# Patient Record
Sex: Male | Born: 1965 | Race: White | Hispanic: No | Marital: Married | Smoking: Never smoker
Health system: Southern US, Community
[De-identification: ages and names within clinical notes are randomized; demographics above are authoritative.]

## PROBLEM LIST (undated history)

## (undated) DIAGNOSIS — F419 Anxiety disorder, unspecified: Secondary | ICD-10-CM

## (undated) HISTORY — DX: Anxiety disorder, unspecified: F41.9

---

## 2014-10-09 ENCOUNTER — Ambulatory Visit (INDEPENDENT_AMBULATORY_CARE_PROVIDER_SITE_OTHER): Payer: BC Managed Care – PPO | Admitting: Podiatry

## 2014-10-09 ENCOUNTER — Encounter: Payer: Self-pay | Admitting: Podiatry

## 2014-10-09 VITALS — BP 155/94 | HR 92 | Resp 16 | Ht 70.0 in | Wt 190.0 lb

## 2014-10-09 DIAGNOSIS — B079 Viral wart, unspecified: Secondary | ICD-10-CM

## 2014-10-09 DIAGNOSIS — B078 Other viral warts: Secondary | ICD-10-CM

## 2014-10-09 NOTE — Progress Notes (Signed)
   Subjective:    Patient ID: Derek [name], male    DOB: 09/25/1965, 49 y.o.   MRN: [id number]  HPI Left foot plantar wart  Started in November. My primary doctor did a treatment on it and it has blistered. That was about a month ago    Review of Systems  All other systems reviewed and are negative.      Objective:   Physical Exam        Assessment & Plan:

## 2014-10-10 NOTE — Progress Notes (Signed)
Subjective:     Patient ID: Derek [name], male   DOB: 12/07/1965, 49 y.o.   MRN: [id number]  HPI patient presents with a 4 month history of lesions on the bottom of his left foot that was treated with cryotherapy with quite a bit of pain associated with it   Review of Systems  All other systems reviewed and are negative.      Objective:   Physical Exam  Constitutional: He is oriented to person, place, and time.  Cardiovascular: Intact distal pulses.   Musculoskeletal: Normal range of motion.  Neurological: He is oriented to person, place, and time.  Skin: Skin is warm.  Nursing note and vitals reviewed.  neurovascular status intact with muscle strength adequate and range of motion subtalar midtarsal joint within normal limits. Patient is noted to have good digital perfusion is well oriented 3 and has several lesions on the plantar aspect of the left distal metatarsal area and of the third digit with him point bleeding noted upon upon debridement and pain to lateral pressure     Assessment:     Probable verruca plantaris plantar aspect left foot with pain    Plan:     H&P and condition explained. Debrided the lesions and applied chemical with sterile dressings and explaining what to do if any kind of blistering were to occur

## 2014-10-17 ENCOUNTER — Ambulatory Visit: Payer: BC Managed Care – PPO | Admitting: Podiatry

## 2014-11-02 ENCOUNTER — Ambulatory Visit: Payer: BC Managed Care – PPO | Admitting: Podiatry

## 2018-06-08 ENCOUNTER — Other Ambulatory Visit: Payer: Self-pay | Admitting: Psychiatry

## 2018-06-08 NOTE — Telephone Encounter (Signed)
Need to review paper chart  

## 2018-07-02 ENCOUNTER — Other Ambulatory Visit: Payer: Self-pay | Admitting: Psychiatry

## 2018-08-04 ENCOUNTER — Ambulatory Visit: Payer: BC Managed Care – PPO | Admitting: Psychiatry

## 2018-08-04 ENCOUNTER — Encounter: Payer: Self-pay | Admitting: Psychiatry

## 2018-08-04 VITALS — BP 157/102 | HR 70

## 2018-08-04 DIAGNOSIS — F418 Other specified anxiety disorders: Secondary | ICD-10-CM | POA: Diagnosis not present

## 2018-08-04 DIAGNOSIS — F4010 Social phobia, unspecified: Secondary | ICD-10-CM | POA: Diagnosis not present

## 2018-08-04 NOTE — Progress Notes (Signed)
Rayshaun Alfieri [id number] 01-06-1966 53 y.o.  Subjective:   Patient ID:  Kyung Woodman is a 53 y.o. (DOB 06/19/1966) male.  Chief Complaint:  Chief Complaint  Patient presents with  . Follow-up    Medication Managament  . Anxiety    HPI Mykhael Swasey presents to the office today for follow-up of anxiety.   Still some anxiety and sweating when meets clients and some anxiety and weight gain.  Tried 20mg  and had SE dryness and sexual SE.  Still fixated on public speaking and getting anxious over that.  Has used propranolol for public speaking anxiety for 83-38 mg daily with some benefit.  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  Prior psych meds:  Zoloft 50 cog SE and felt physically revved?, Lexapro 20.  Review of Systems:  Review of Systems  Neurological: Negative for tremors and weakness.  Psychiatric/Behavioral: Negative for agitation, behavioral problems, confusion, decreased concentration, dysphoric mood, hallucinations, self-injury, sleep disturbance and suicidal ideas. The patient is nervous/anxious. The patient is not hyperactive.     Medications: I have reviewed the patient's current medications.  Current Outpatient Medications  Medication Sig Dispense Refill  . lithium carbonate 150 MG capsule Take 150 mg by mouth daily.    [name] PARoxetine (PAXIL) 10 MG tablet TAKE ONE AND ONE-HALF TABLETS BY MOUTH DAILY AT BEDTIME 135 tablet 0  . propranolol (INDERAL) 20 MG tablet TAKE ONE TO TWO TABLETS BY MOUTH TWICE A DAY AS NEEDED FOR ANXIETY 100 tablet 1   No current facility-administered medications for this visit.     Medication Side Effects: Other: as noted.  Allergies: No Known Allergies  Past Medical History:  Diagnosis Date  . Anxiety     Family History  Problem Relation Age of Onset  . Anxiety disorder Father   . Depression Father     Social History   Socioeconomic History  . Marital status: Married    Spouse name: Not on file  . Number of  children: Not on file  . Years of education: Not on file  . Highest education level: Not on file  Occupational History  . Not on file  Social Needs  . Financial resource strain: Not on file  . Food insecurity:    Worry: Not on file    Inability: Not on file  . Transportation needs:    Medical: Not on file    Non-medical: Not on file  Tobacco Use  . Smoking status: Never Smoker  . Smokeless tobacco: Former Engineer, water and Sexual Activity  . Alcohol use: Yes    Comment: weekly-social  . Drug use: Never  . Sexual activity: Yes    Partners: Female    Birth control/protection: None  Lifestyle  . Physical activity:    Days per week: Not on file    Minutes per session: Not on file  . Stress: Not on file  Relationships  . Social connections:    Talks on phone: Not on file    Gets together: Not on file    Attends religious service: Not on file    Active member of club or organization: Not on file    Attends meetings of clubs or organizations: Not on file    Relationship status: Not on file  . Intimate partner violence:    Fear of current or ex partner: Not on file    Emotionally abused: Not on file    Physically abused: Not on file    Forced sexual  activity: Not on file  Other Topics Concern  . Not on file  Social History Narrative  . Not on file    Past Medical History, Surgical history, Social history, and Family history were reviewed and updated as appropriate.   Please see review of systems for further details on the patient's review from today.   Objective:   Physical Exam:  BP (!) 157/102 (BP Location: Left Arm)   Pulse 70   Physical Exam Constitutional:      General: He is not in acute distress.    Appearance: He is well-developed.  Musculoskeletal:        General: No deformity.  Neurological:     Mental Status: He is alert and oriented to person, place, and time.     Motor: No tremor.     Coordination: Coordination normal.     Gait: Gait normal.   Psychiatric:        Attention and Perception: Attention and perception normal.        Mood and Affect: Mood is anxious. Mood is not depressed. Affect is not labile, blunt, angry or inappropriate.        Speech: Speech normal.        Behavior: Behavior normal.        Thought Content: Thought content normal. Thought content does not include homicidal or suicidal ideation. Thought content does not include homicidal or suicidal plan.        Cognition and Memory: Cognition normal.        Judgment: Judgment normal.     Comments: Insight intact. No auditory or visual hallucinations. No delusions.      Lab Review:  No results found for: NA, K, CL, CO2, GLUCOSE, BUN, CREATININE, CALCIUM, PROT, ALBUMIN, AST, ALT, ALKPHOS, BILITOT, GFRNONAA, GFRAA  No results found for: WBC, RBC, HGB, HCT, PLT, MCV, MCH, MCHC, RDW, LYMPHSABS, MONOABS, EOSABS, BASOSABS  No results found for: POCLITH, LITHIUM   No results found for: PHENYTOIN, PHENOBARB, VALPROATE, CBMZ   .res Assessment: Plan:    Social anxiety disorder  Performance anxiety   Greater than 50% of face to face time with patient was spent on counseling and coordination of care. We discussed  The following:  Disc potential cognitive benefit off label of N-acetylcysteine 600 mg daily.  Options increase paroxetine, return to Zoloft  And increase the dosage, viibryd.  Discussed these options in detail.  Because he felt revved up from sertraline he is probably not going to be able to tolerate fluoxetine.  Discussed the differences between SSRIs side effect profiles. He prefers retry increasing the paroxetine back to 20 mg daily.  Option clonidine as alternative for sweating.  This appt was 30 mins.  Meredith Staggersarey Cottle, MD, DFAPA    Please see After Visit Summary for patient specific instructions.  No future appointments.  No orders of the defined types were placed in this encounter.     -------------------------------

## 2018-08-28 ENCOUNTER — Other Ambulatory Visit: Payer: Self-pay | Admitting: Psychiatry

## 2018-09-29 ENCOUNTER — Encounter: Payer: Self-pay | Admitting: Psychiatry

## 2018-09-29 ENCOUNTER — Ambulatory Visit: Payer: BC Managed Care – PPO | Admitting: Psychiatry

## 2018-09-29 ENCOUNTER — Other Ambulatory Visit: Payer: Self-pay

## 2018-09-29 DIAGNOSIS — F418 Other specified anxiety disorders: Secondary | ICD-10-CM

## 2018-09-29 DIAGNOSIS — F4010 Social phobia, unspecified: Secondary | ICD-10-CM

## 2018-09-29 MED ORDER — PAROXETINE HCL 20 MG PO TABS: 20.0000 mg | ORAL_TABLET | Freq: Every day | ORAL | 0 refills | Status: DC

## 2018-09-29 NOTE — Patient Instructions (Signed)
Plan types include [place], sword plants including chain sword plants.  Google search low tech aquarium plants.  Planted tank substrate by [employer] Don't get [employer] substrate.

## 2018-09-29 NOTE — Progress Notes (Signed)
Derek [name] [id number] 09-Jan-1966 53 y.o.  Subjective:   Patient ID:  Derek [name] is a 53 y.o. (DOB 25-Nov-1965) male.  Chief Complaint:  Chief Complaint  Patient presents with  . Follow-up    Medication Management  . Anxiety    Medication Management    Anxiety  Patient reports no confusion, decreased concentration, nervous/anxious behavior or suicidal ideas.     Derek [name] presents to the office today for follow-up of anxiety.   Patient last seen August 04, 2018.  For his anxiety we increased paroxetine back to 20 from 15 mg daily.  He was also given the option of N-acetylcysteine for cognitive complaints.  Tolerating it better than the first time but still unusual gum bleeding tolerable.  Much better re: anxiety.  SE endless grazing urge in the evening.  Anxiety is now manageable.   Weight up 2-3# since here.  Much less anxiety and sweating when meets clients and some anxiety.  Tried 20mg  and had SE dryness and sexual SE.  Less fixated on public speaking and getting anxious over that.  Has used propranolol for public speaking anxiety for 54-93 mg daily with some benefit and when flying and this has helped..  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  He is planning planted aquarium  Prior psych meds:  Zoloft 50 cog SE and felt physically revved even after a year and initital insomnia, Lexapro 20.  Review of Systems:  Review of Systems  Neurological: Negative for tremors and weakness.  Psychiatric/Behavioral: Negative for agitation, behavioral problems, confusion, decreased concentration, dysphoric mood, hallucinations, self-injury, sleep disturbance and suicidal ideas. The patient is not nervous/anxious and is not hyperactive.     Medications: I have reviewed the patient's current medications.  Current Outpatient Medications  Medication Sig Dispense Refill  . lithium carbonate 150 MG capsule Take 150 mg by mouth daily.    [name] PARoxetine (PAXIL) 20 MG  tablet Take 1 tablet (20 mg total) by mouth at bedtime. 30 tablet 2  . propranolol (INDERAL) 20 MG tablet TAKE ONE TO TWO TABLETS BY MOUTH TWICE A DAY AS NEEDED FOR ANXIETY 100 tablet 1   No current facility-administered medications for this visit.     Medication Side Effects: increased appetite, some sexual SE  Allergies: No Known Allergies  Past Medical History:  Diagnosis Date  . Anxiety     Family History  Problem Relation Age of Onset  . Anxiety disorder Father   . Depression Father     Social History   Socioeconomic History  . Marital status: Married    Spouse name: Not on file  . Number of children: Not on file  . Years of education: Not on file  . Highest education level: Not on file  Occupational History  . Not on file  Social Needs  . Financial resource strain: Not on file  . Food insecurity:    Worry: Not on file    Inability: Not on file  . Transportation needs:    Medical: Not on file    Non-medical: Not on file  Tobacco Use  . Smoking status: Never Smoker  . Smokeless tobacco: Former Engineer, water and Sexual Activity  . Alcohol use: Yes    Comment: weekly-social  . Drug use: Never  . Sexual activity: Yes    Partners: Female    Birth control/protection: None  Lifestyle  . Physical activity:    Days per week: Not on file    Minutes per  session: Not on file  . Stress: Not on file  Relationships  . Social connections:    Talks on phone: Not on file    Gets together: Not on file    Attends religious service: Not on file    Active member of club or organization: Not on file    Attends meetings of clubs or organizations: Not on file    Relationship status: Not on file  . Intimate partner violence:    Fear of current or ex partner: Not on file    Emotionally abused: Not on file    Physically abused: Not on file    Forced sexual activity: Not on file  Other Topics Concern  . Not on file  Social History Narrative  . Not on file    Past  Medical History, Surgical history, Social history, and Family history were reviewed and updated as appropriate.   Please see review of systems for further details on the patient's review from today.   Objective:   Physical Exam:  There were no vitals taken for this visit.  Physical Exam Constitutional:      General: He is not in acute distress.    Appearance: He is well-developed.  Musculoskeletal:        General: No deformity.  Neurological:     Mental Status: He is alert and oriented to person, place, and time.     Motor: No tremor.     Coordination: Coordination normal.     Gait: Gait normal.  Psychiatric:        Attention and Perception: Attention and perception normal.        Mood and Affect: Mood is not anxious or depressed. Affect is not labile, blunt, angry or inappropriate.        Speech: Speech normal.        Behavior: Behavior normal.        Thought Content: Thought content normal. Thought content does not include homicidal or suicidal ideation. Thought content does not include homicidal or suicidal plan.        Cognition and Memory: Cognition normal.        Judgment: Judgment normal.     Comments: Insight intact. No auditory or visual hallucinations. No delusions.      Lab Review:  No results found for: NA, K, CL, CO2, GLUCOSE, BUN, CREATININE, CALCIUM, PROT, ALBUMIN, AST, ALT, ALKPHOS, BILITOT, GFRNONAA, GFRAA  No results found for: WBC, RBC, HGB, HCT, PLT, MCV, MCH, MCHC, RDW, LYMPHSABS, MONOABS, EOSABS, BASOSABS  No results found for: POCLITH, LITHIUM   No results found for: PHENYTOIN, PHENOBARB, VALPROATE, CBMZ   .res Assessment: Plan:    Social anxiety disorder  Performance anxiety   Overall good response to the paroxetine with anxiety managed.  However he has had some sexual side effects and increased appetite.  We discussed the pros and cons of continuing as is versus possibly start switching back to sertraline.  He may tolerate that better now than  he did before.  I suggested he wait 3 more months and see if perhaps he will have less side effects with the paroxetine over time and then consider a switch if necessary.  Disc potential cognitive benefit off label of N-acetylcysteine 600 mg daily.  The propranolol is also helping situational anxiety.  Option clonidine as alternative for sweating.  FU 3 mos  Meredith Staggers, MD, DFAPA    Please see After Visit Summary for patient specific instructions.  No future appointments.  No  orders of the defined types were placed in this encounter.     -------------------------------

## 2018-12-21 ENCOUNTER — Ambulatory Visit: Payer: BC Managed Care – PPO | Admitting: Psychiatry

## 2019-02-16 ENCOUNTER — Encounter (INDEPENDENT_AMBULATORY_CARE_PROVIDER_SITE_OTHER): Payer: Self-pay

## 2019-02-16 ENCOUNTER — Encounter

## 2019-02-16 ENCOUNTER — Other Ambulatory Visit: Payer: Self-pay

## 2019-02-16 ENCOUNTER — Ambulatory Visit (INDEPENDENT_AMBULATORY_CARE_PROVIDER_SITE_OTHER): Payer: BC Managed Care – PPO | Admitting: Psychiatry

## 2019-02-16 ENCOUNTER — Encounter: Payer: Self-pay | Admitting: Psychiatry

## 2019-02-16 DIAGNOSIS — F418 Other specified anxiety disorders: Secondary | ICD-10-CM | POA: Diagnosis not present

## 2019-02-16 DIAGNOSIS — F4010 Social phobia, unspecified: Secondary | ICD-10-CM | POA: Diagnosis not present

## 2019-02-16 MED ORDER — VIIBRYD 20 MG PO TABS: 20.0000 mg | ORAL_TABLET | Freq: Every day | ORAL | 0 refills | Status: DC

## 2019-02-16 NOTE — Patient Instructions (Signed)
Start Viibryd 10 mg daily with food and reduce paroxetine to 1/2 tablet daily.  After 1 week increase Viibryd to 20 mg daily and  Continue 1/2 paroxetine for 1 more week then stop it.

## 2019-02-16 NOTE — Progress Notes (Signed)
Gurinder Toral [id number] 1965-11-09 53 y.o.  Subjective:   Patient ID:  Derek [name] is a 53 y.o. (DOB February 11, 1966) male.  Chief Complaint:  Chief Complaint  Patient presents with  . Follow-up    Medication Management  . Anxiety  . Medication Problem    Anxiety Patient reports no confusion, decreased concentration, nervous/anxious behavior or suicidal ideas.     Derek [name] presents to the office today for follow-up of anxiety.   When seen August 04, 2018.  For his anxiety we increased paroxetine back to 20 from 15 mg daily.  He was also given the option of N-acetylcysteine for cognitive complaints.  Last seen in March.  No meds were changed.  He was satisfied with response.  Not in th e office for 5 mos DT Covid.  Energy a little lower with paroxetine.  Not as bad as with sertraline and motivation a little lower.  Still some sweating socially even with close friends.  Lower libido and delayed ejaculation.  Also weight px.  Wants med change.  Less assertive in office meetings not related to anxiety.  No significant propranolol.    Tolerating it better than the first time but still unusual gum bleeding tolerable.  Much better re: anxiety.  SE endless grazing urge in the evening.  Anxiety is now manageable.   Weight up 2-3# since here.  Much less anxiety and sweating when meets clients and some anxiety.  Tried 20mg  and had SE dryness and sexual SE.  Less fixated on public speaking and getting anxious over that.  Has used propranolol for public speaking anxiety for 20-40 mg daily with some benefit and when flying and this has helped..  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  He is planning planted aquarium  Prior psych meds:  Zoloft 50 cog SE and felt physically revved even after a year and initital insomnia, Lexapro 20.  Review of Systems:  Review of Systems  Constitutional: Positive for unexpected weight change.  Neurological: Negative for tremors and  weakness.  Psychiatric/Behavioral: Negative for agitation, behavioral problems, confusion, decreased concentration, dysphoric mood, hallucinations, self-injury, sleep disturbance and suicidal ideas. The patient is not nervous/anxious and is not hyperactive.     Medications: I have reviewed the patient's current medications.  Current Outpatient Medications  Medication Sig Dispense Refill  . Ascorbic Acid (VITAMIN C) 1000 MG tablet Take 1,000 mg by mouth daily.    [name] lithium carbonate 150 MG capsule Take 150 mg by mouth daily.    . Magnesium 100 MG TABS Take by mouth.    [name] PARoxetine (PAXIL) 20 MG tablet Take 1 tablet (20 mg total) by mouth at bedtime. 90 tablet 0  . propranolol (INDERAL) 20 MG tablet TAKE ONE TO TWO TABLETS BY MOUTH TWICE A DAY AS NEEDED FOR ANXIETY 100 tablet 1  . Zinc Sulfate (ZINC 15 PO) Take by mouth.    . Vilazodone HCl (VIIBRYD) 20 MG TABS Take 1 tablet (20 mg total) by mouth daily. 90 tablet 0   No current facility-administered medications for this visit.     Medication Side Effects: increased appetite, some sexual SE  Allergies: No Known Allergies  Past Medical History:  Diagnosis Date  . Anxiety     Family History  Problem Relation Age of Onset  . Anxiety disorder Father   . Depression Father     Social History   Socioeconomic History  . Marital status: Married    Spouse name: Not on file  .  Number of children: Not on file  . Years of education: Not on file  . Highest education level: Not on file  Occupational History  . Not on file  Social Needs  . Financial resource strain: Not on file  . Food insecurity    Worry: Not on file    Inability: Not on file  . Transportation needs    Medical: Not on file    Non-medical: Not on file  Tobacco Use  . Smoking status: Never Smoker  . Smokeless tobacco: Former Engineer, waterUser  Substance and Sexual Activity  . Alcohol use: Yes    Comment: weekly-social  . Drug use: Never  . Sexual activity: Yes     Partners: Female    Birth control/protection: None  Lifestyle  . Physical activity    Days per week: Not on file    Minutes per session: Not on file  . Stress: Not on file  Relationships  . Social Musicianconnections    Talks on phone: Not on file    Gets together: Not on file    Attends religious service: Not on file    Active member of club or organization: Not on file    Attends meetings of clubs or organizations: Not on file    Relationship status: Not on file  . Intimate partner violence    Fear of current or ex partner: Not on file    Emotionally abused: Not on file    Physically abused: Not on file    Forced sexual activity: Not on file  Other Topics Concern  . Not on file  Social History Narrative  . Not on file    Past Medical History, Surgical history, Social history, and Family history were reviewed and updated as appropriate.   Please see review of systems for further details on the patient's review from today.   Objective:   Physical Exam:  There were no vitals taken for this visit.  Physical Exam Constitutional:      General: He is not in acute distress.    Appearance: He is well-developed.  Musculoskeletal:        General: No deformity.  Neurological:     Mental Status: He is alert and oriented to person, place, and time.     Motor: No tremor.     Coordination: Coordination normal.     Gait: Gait normal.  Psychiatric:        Attention and Perception: Attention and perception normal.        Mood and Affect: Mood is anxious. Mood is not depressed. Affect is not labile, blunt, angry or inappropriate.        Speech: Speech normal.        Behavior: Behavior normal.        Thought Content: Thought content normal. Thought content does not include homicidal or suicidal ideation. Thought content does not include homicidal or suicidal plan.        Cognition and Memory: Cognition normal.        Judgment: Judgment normal.     Comments: Insight intact. No auditory or  visual hallucinations. No delusions.      Lab Review:  No results found for: NA, K, CL, CO2, GLUCOSE, BUN, CREATININE, CALCIUM, PROT, ALBUMIN, AST, ALT, ALKPHOS, BILITOT, GFRNONAA, GFRAA  No results found for: WBC, RBC, HGB, HCT, PLT, MCV, MCH, MCHC, RDW, LYMPHSABS, MONOABS, EOSABS, BASOSABS  No results found for: POCLITH, LITHIUM   No results found for: PHENYTOIN, PHENOBARB, VALPROATE, CBMZ   .  res Assessment: Plan:    Derek [name] was seen today for follow-up, anxiety and medication problem.  Diagnoses and all orders for this visit:  Social anxiety disorder -     Vilazodone HCl (VIIBRYD) 20 MG TABS; Take 1 tablet (20 mg total) by mouth daily.  Performance anxiety  Overall good response to the paroxetine with anxiety managed.  However he has had some sexual side effects and increased appetite.  Switch to Viibryd to reduce lethargy, weight, sexual SE.  Cross taper to 20 mg daily.   Disc SE in detail and SSRI withdrawal sx.  Also trial Qbrexxza for excessive sweating.  Disc FDA indications for under arm.  Disc SE.  Disc potential cognitive benefit off label of N-acetylcysteine 600 mg daily.  The propranolol is also helping situational anxiety.  Option clonidine as alternative for sweating.  FU 3 mos  Meredith Staggersarey Cottle, MD, DFAPA    Please see After Visit Summary for patient specific instructions.  Future Appointments  Date Time Provider Department Center  04/19/2019 11:00 AM Cottle, Steva Readyarey G Jr., MD [facility] None    No orders of the defined types were placed in this encounter.     -------------------------------

## 2019-03-31 ENCOUNTER — Ambulatory Visit (INDEPENDENT_AMBULATORY_CARE_PROVIDER_SITE_OTHER): Payer: BC Managed Care – PPO | Admitting: Psychiatry

## 2019-03-31 ENCOUNTER — Other Ambulatory Visit: Payer: Self-pay

## 2019-03-31 ENCOUNTER — Encounter: Payer: Self-pay | Admitting: Psychiatry

## 2019-03-31 DIAGNOSIS — F418 Other specified anxiety disorders: Secondary | ICD-10-CM

## 2019-03-31 DIAGNOSIS — R61 Generalized hyperhidrosis: Secondary | ICD-10-CM | POA: Diagnosis not present

## 2019-03-31 DIAGNOSIS — F4010 Social phobia, unspecified: Secondary | ICD-10-CM

## 2019-03-31 MED ORDER — LITHIUM CARBONATE 150 MG PO CAPS: 150.0000 mg | ORAL_CAPSULE | Freq: Every day | ORAL | 1 refills | Status: DC

## 2019-03-31 NOTE — Progress Notes (Signed)
Derek [name] [id number] 1966-03-19 53 y.o.  Subjective:   Patient ID:  Derek [name] is a 53 y.o. (DOB 13-Jan-1966) male.  Chief Complaint:  Chief Complaint  Patient presents with  . Follow-up    Medication Management and med change  . Anxiety    Medication Management  . Excessive Sweating    Follow-up medication    Anxiety Patient reports no confusion, decreased concentration, nervous/anxious behavior or suicidal ideas.     Derek [name] presents to the office today for follow-up of anxiety, especially social anxiety disorder.   Last visit February 16, 2019.  Because of sexual side effects with paroxetine we switched him to Viibryd.  Also initiated a trial of trial Qbrexxza for excessive sweating.  When first came in had come off anxiety and had panic and froze in public speaking for 30 min.  Wonders if has PTSD from it and fear of recurrence.  2 occ of near this since on Viibryd, 1 in social setting and other in preparation for performance anxiety situation.  Slightly more amped up, a little SOB, quicker to anger.  Another increase in weight 4-5 #.  A little trouble multitasking.  A little more trouble going to sleep.  No sig change in sexual function.  Some non problematic diarrhea.  But history of frequent BM. Better energy vs Paxil.     Not in th e office for 5 mos DT Covid.  Energy a little lower with paroxetine.  Not as bad as with sertraline and motivation a little lower.  Still some sweating socially even with close friends.  Lower libido and delayed ejaculation.  Also weight px.  Wants med change.  Less assertive in office meetings not related to anxiety.  No significant propranolol.    Tolerating it better than the first time but still unusual gum bleeding tolerable.  Much better re: anxiety.  SE endless grazing urge in the evening.  Anxiety is now manageable.   Weight up 2-3# since here.  Much less anxiety and sweating when meets clients and some anxiety.  Tried 20mg  and  had SE dryness and sexual SE.  Less fixated on public speaking and getting anxious over that.  Has used propranolol for public speaking anxiety for 62-13 mg daily with some benefit and when flying and this has helped..  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  He is planning planted aquarium  Prior psych meds:  Zoloft 50 cog SE and felt physically revved even after a year and initital insomnia, Lexapro 20, paroxetine helped but sexual SE  Review of Systems:  Review of Systems  Constitutional: Negative for unexpected weight change.  Neurological: Negative for tremors and weakness.  Psychiatric/Behavioral: Negative for agitation, behavioral problems, confusion, decreased concentration, dysphoric mood, hallucinations, self-injury, sleep disturbance and suicidal ideas. The patient is not nervous/anxious and is not hyperactive.     Medications: I have reviewed the patient's current medications.  Current Outpatient Medications  Medication Sig Dispense Refill  . Ascorbic Acid (VITAMIN C) 1000 MG tablet Take 1,000 mg by mouth daily.    [name] lithium carbonate 150 MG capsule Take 150 mg by mouth daily.    . Magnesium 100 MG TABS Take by mouth.    . propranolol (INDERAL) 20 MG tablet TAKE ONE TO TWO TABLETS BY MOUTH TWICE A DAY AS NEEDED FOR ANXIETY 100 tablet 1  . Vilazodone HCl (VIIBRYD) 20 MG TABS Take 1 tablet (20 mg total) by mouth daily. 90 tablet 0  .  Zinc Sulfate (ZINC 15 PO) Take by mouth.     No current facility-administered medications for this visit.     Medication Side Effects: increased appetite, some sexual SE  Allergies: No Known Allergies  Past Medical History:  Diagnosis Date  . Anxiety     Family History  Problem Relation Age of Onset  . Anxiety disorder Father   . Depression Father     Social History   Socioeconomic History  . Marital status: Married    Spouse name: Not on file  . Number of children: Not on file  . Years of education: Not on  file  . Highest education level: Not on file  Occupational History  . Not on file  Social Needs  . Financial resource strain: Not on file  . Food insecurity    Worry: Not on file    Inability: Not on file  . Transportation needs    Medical: Not on file    Non-medical: Not on file  Tobacco Use  . Smoking status: Never Smoker  . Smokeless tobacco: Former Engineer, waterUser  Substance and Sexual Activity  . Alcohol use: Yes    Comment: weekly-social  . Drug use: Never  . Sexual activity: Yes    Partners: Female    Birth control/protection: None  Lifestyle  . Physical activity    Days per week: Not on file    Minutes per session: Not on file  . Stress: Not on file  Relationships  . Social Musicianconnections    Talks on phone: Not on file    Gets together: Not on file    Attends religious service: Not on file    Active member of club or organization: Not on file    Attends meetings of clubs or organizations: Not on file    Relationship status: Not on file  . Intimate partner violence    Fear of current or ex partner: Not on file    Emotionally abused: Not on file    Physically abused: Not on file    Forced sexual activity: Not on file  Other Topics Concern  . Not on file  Social History Narrative  . Not on file    Past Medical History, Surgical history, Social history, and Family history were reviewed and updated as appropriate.   Please see review of systems for further details on the patient's review from today.   Objective:   Physical Exam:  There were no vitals taken for this visit.  Physical Exam Constitutional:      General: He is not in acute distress.    Appearance: He is well-developed.  Musculoskeletal:        General: No deformity.  Neurological:     Mental Status: He is alert and oriented to person, place, and time.     Motor: No tremor.     Coordination: Coordination normal.     Gait: Gait normal.  Psychiatric:        Attention and Perception: Attention and  perception normal.        Mood and Affect: Mood is anxious. Mood is not depressed. Affect is not labile, blunt, angry, tearful or inappropriate.        Speech: Speech normal.        Behavior: Behavior normal.        Thought Content: Thought content normal. Thought content does not include homicidal or suicidal ideation. Thought content does not include homicidal or suicidal plan.  Cognition and Memory: Cognition normal.        Judgment: Judgment normal.     Comments: Insight intact. No auditory or visual hallucinations. No delusions.      Lab Review:  No results found for: NA, K, CL, CO2, GLUCOSE, BUN, CREATININE, CALCIUM, PROT, ALBUMIN, AST, ALT, ALKPHOS, BILITOT, GFRNONAA, GFRAA  No results found for: WBC, RBC, HGB, HCT, PLT, MCV, MCH, MCHC, RDW, LYMPHSABS, MONOABS, EOSABS, BASOSABS  No results found for: POCLITH, LITHIUM   No results found for: PHENYTOIN, PHENOBARB, VALPROATE, CBMZ   .res Assessment: Plan:    Derek [name] was seen today for follow-up, anxiety and excessive sweating.  Diagnoses and all orders for this visit:  Social anxiety disorder  Performance anxiety  Excessive sweating  Side effects with Viibryd   Overall had good response to the paroxetine with anxiety managed but SE fatigue,  sexual side effects and increased appetite.  Switch to Viibryd produced better energy but a recurrence of anxiety and so far not better with weight, sexual SE.  Increase Viibryd to 30 mg daily  For 1 week, then 40 mg daily.   Disc SE in detail and SSRI withdrawal sx.  Disc option Bz prn panic but disc it's not ideal for performance anxiety dt potential cognitive risks.  Other option fluvoxamine  Also trial Qbrexxza for excessive sweating.  He hasn't tried it yet.  Disc FDA indications for under arm.  Disc SE.  Disc potential cognitive benefit off label of N-acetylcysteine 600 mg daily.  The propranolol is also helping situational anxiety.  Option clonidine as alternative  for sweating.  Lithium for neuroprotections per Dr. Olene Floss  FU 6 weeks  Lynder Parents, MD, DFAPA    Please see After Visit Summary for patient specific instructions.  No future appointments.  No orders of the defined types were placed in this encounter.     -------------------------------

## 2019-04-19 ENCOUNTER — Ambulatory Visit: Payer: BC Managed Care – PPO | Admitting: Psychiatry

## 2019-05-11 ENCOUNTER — Ambulatory Visit (INDEPENDENT_AMBULATORY_CARE_PROVIDER_SITE_OTHER): Payer: BC Managed Care – PPO | Admitting: Psychiatry

## 2019-05-11 ENCOUNTER — Encounter: Payer: Self-pay | Admitting: Psychiatry

## 2019-05-11 ENCOUNTER — Other Ambulatory Visit: Payer: Self-pay

## 2019-05-11 DIAGNOSIS — R61 Generalized hyperhidrosis: Secondary | ICD-10-CM | POA: Diagnosis not present

## 2019-05-11 DIAGNOSIS — F418 Other specified anxiety disorders: Secondary | ICD-10-CM | POA: Diagnosis not present

## 2019-05-11 DIAGNOSIS — F4010 Social phobia, unspecified: Secondary | ICD-10-CM

## 2019-05-11 MED ORDER — VIIBRYD 40 MG PO TABS: 40.0000 mg | ORAL_TABLET | Freq: Every day | ORAL | 1 refills | Status: DC

## 2019-05-11 MED ORDER — PROPRANOLOL HCL 20 MG PO TABS: ORAL_TABLET | ORAL | 1 refills | Status: DC

## 2019-05-11 NOTE — Progress Notes (Signed)
Derek [name] [id number] 08/01/1965 53 y.o.  Subjective:   Patient ID:  Derek [name] is a 53 y.o. (DOB [birth year]) male.  Chief Complaint:  Chief Complaint  Patient presents with  . Follow-up    Medication Management andchanges  . Anxiety    Medication Management    Anxiety Symptoms include nervous/anxious behavior. Patient reports no confusion, decreased concentration or suicidal ideas.     Derek [name] presents to the office today for follow-up of anxiety, especially social anxiety disorder.   at visit February 16, 2019.  Because of sexual side effects with paroxetine we switched him to [place].  Also initiated a trial of trial Qbrexxza for excessive sweating.  At last visit September 17 after switch from paroxetine to Viibryd 20 mg he noticed a reduction in sexual side effects but an increase in anxiety.  The plan was to increase the Viibryd to 40 mg to get maximum benefit for anxiety.Also trial Qbrexxza for excessive sweating.   Anxiety is different.  More intense performance anxiety more like panic when have triggers.  Feels more clear and not blunted than with Paxil.   Tried propranolol once with benefit.  When first came in had come off anxiety and had panic and froze in public speaking for 30 min.  Wonders if has PTSD from it and fear of recurrence. Better sleep.  Training in trancendental meditation to help.  Much less anxiety and sweating when meets clients and some anxiety.  Tried 20mg  and had SE dryness and sexual SE.  Less fixated on public speaking and getting anxious over that.  Has used propranolol for public speaking anxiety for 20-40 mg daily with some benefit and when flying and this has helped..  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  He is planning planted aquarium  Prior psych meds:  Zoloft 50 cog SE and felt physically revved even after a year and initital insomnia, Lexapro 20, paroxetine helped but sexual SE, Viibryd 40 mg  Review of  Systems:  Review of Systems  Constitutional: Negative for unexpected weight change.  Neurological: Negative for tremors and weakness.  Psychiatric/Behavioral: Negative for agitation, behavioral problems, confusion, decreased concentration, dysphoric mood, hallucinations, self-injury, sleep disturbance and suicidal ideas. The patient is nervous/anxious. The patient is not hyperactive.     Medications: I have reviewed the patient's current medications.  Current Outpatient Medications  Medication Sig Dispense Refill  . Ascorbic Acid (VITAMIN C) 1000 MG tablet Take 1,000 mg by mouth daily.    [name] lithium carbonate 150 MG capsule Take 1 capsule (150 mg total) by mouth daily. 90 capsule 1  . Magnesium 100 MG TABS Take by mouth.    . propranolol (INDERAL) 20 MG tablet TAKE ONE TO TWO TABLETS BY MOUTH TWICE A DAY AS NEEDED FOR ANXIETY 100 tablet 1  . Zinc Sulfate (ZINC 15 PO) Take by mouth.    . Vilazodone HCl (VIIBRYD) 40 MG TABS Take 1 tablet (40 mg total) by mouth daily. 30 tablet 1   No current facility-administered medications for this visit.     Medication Side Effects: 1/2 sexual SE.  Allergies: No Known Allergies  Past Medical History:  Diagnosis Date  . Anxiety     Family History  Problem Relation Age of Onset  . Anxiety disorder Father   . Depression Father     Social History   Socioeconomic History  . Marital status: Married    Spouse name: Not on file  . Number of children: Not on  file  . Years of education: Not on file  . Highest education level: Not on file  Occupational History  . Not on file  Social Needs  . Financial resource strain: Not on file  . Food insecurity    Worry: Not on file    Inability: Not on file  . Transportation needs    Medical: Not on file    Non-medical: Not on file  Tobacco Use  . Smoking status: Never Smoker  . Smokeless tobacco: Former Engineer, water and Sexual Activity  . Alcohol use: Yes    Comment: weekly-social  . Drug  use: Never  . Sexual activity: Yes    Partners: Female    Birth control/protection: None  Lifestyle  . Physical activity    Days per week: Not on file    Minutes per session: Not on file  . Stress: Not on file  Relationships  . Social Musician on phone: Not on file    Gets together: Not on file    Attends religious service: Not on file    Active member of club or organization: Not on file    Attends meetings of clubs or organizations: Not on file    Relationship status: Not on file  . Intimate partner violence    Fear of current or ex partner: Not on file    Emotionally abused: Not on file    Physically abused: Not on file    Forced sexual activity: Not on file  Other Topics Concern  . Not on file  Social History Narrative  . Not on file    Past Medical History, Surgical history, Social history, and Family history were reviewed and updated as appropriate.   Please see review of systems for further details on the patient's review from today.   Objective:   Physical Exam:  There were no vitals taken for this visit.  Physical Exam Constitutional:      General: He is not in acute distress.    Appearance: He is well-developed.  Musculoskeletal:        General: No deformity.  Neurological:     Mental Status: He is alert and oriented to person, place, and time.     Motor: No tremor.     Coordination: Coordination normal.     Gait: Gait normal.  Psychiatric:        Attention and Perception: Attention and perception normal.        Mood and Affect: Mood is anxious. Mood is not depressed. Affect is not labile, blunt, angry, tearful or inappropriate.        Speech: Speech normal.        Behavior: Behavior normal.        Thought Content: Thought content normal. Thought content does not include homicidal or suicidal ideation. Thought content does not include homicidal or suicidal plan.        Cognition and Memory: Cognition normal.        Judgment: Judgment  normal.     Comments: Insight intact. No auditory or visual hallucinations. No delusions.      Lab Review:  No results found for: NA, K, CL, CO2, GLUCOSE, BUN, CREATININE, CALCIUM, PROT, ALBUMIN, AST, ALT, ALKPHOS, BILITOT, GFRNONAA, GFRAA  No results found for: WBC, RBC, HGB, HCT, PLT, MCV, MCH, MCHC, RDW, LYMPHSABS, MONOABS, EOSABS, BASOSABS  No results found for: POCLITH, LITHIUM   No results found for: PHENYTOIN, PHENOBARB, VALPROATE, CBMZ   .res Assessment: Plan:  Derek [name] was seen today for follow-up and anxiety.  Diagnoses and all orders for this visit:  Social anxiety disorder -     Vilazodone HCl (VIIBRYD) 40 MG TABS; Take 1 tablet (40 mg total) by mouth daily. -     propranolol (INDERAL) 20 MG tablet; TAKE ONE TO TWO TABLETS BY MOUTH TWICE A DAY AS NEEDED FOR ANXIETY  Performance anxiety -     propranolol (INDERAL) 20 MG tablet; TAKE ONE TO TWO TABLETS BY MOUTH TWICE A DAY AS NEEDED FOR ANXIETY  Excessive sweating  Sexual Side effects with Viibryd  Greater than 50% of 30 min face to face time with patient was spent on counseling and coordination of care. We discussed  Overall had good response to the paroxetine with anxiety managed but SE fatigue,  sexual side effects, some blunting and increased appetite.  Switch to Viibryd produced better energy but a recurrence of anxiety and so far not better with weight, sexual SE.    Disc SE in detail and SSRI withdrawal sx.  Several options include wait, switch to Luvox, increase Viibryd, combine low dose Viibryd with low dose Paxil or Lexapro, or return to Lexapro it worked fairly well but some blunting and sweating but little sexual SE and probably adequately managed the anxiety.  He prefers to continue Viibryd 40 mg for 6 more weeks and then reevaluate.  He may get additional improvement with more time.  No med changes today.  Disc option Bz prn panic but disc it's not ideal for performance anxiety dt potential cognitive  risks.  Also trial Qbrexxza for excessive sweating.  He hasn't tried it yet.  Disc FDA indications for under arm.  Disc SE.  Disc potential cognitive benefit off label of N-acetylcysteine 600 mg daily.  The propranolol is also helping situational anxiety.  Option clonidine as alternative for sweating.  Lithium for neuroprotections per Dr. Arlie SolomonsPost.  Continue this.  He is taking it.  FU 6 weeks  Meredith Staggersarey Cottle, MD, DFAPA    Please see After Visit Summary for patient specific instructions.  No future appointments.  No orders of the defined types were placed in this encounter.     -------------------------------

## 2019-06-22 ENCOUNTER — Encounter: Payer: Self-pay | Admitting: Psychiatry

## 2019-06-22 ENCOUNTER — Other Ambulatory Visit: Payer: Self-pay

## 2019-06-22 ENCOUNTER — Ambulatory Visit (INDEPENDENT_AMBULATORY_CARE_PROVIDER_SITE_OTHER): Payer: BC Managed Care – PPO | Admitting: Psychiatry

## 2019-06-22 DIAGNOSIS — F5221 Male erectile disorder: Secondary | ICD-10-CM | POA: Diagnosis not present

## 2019-06-22 DIAGNOSIS — R61 Generalized hyperhidrosis: Secondary | ICD-10-CM

## 2019-06-22 DIAGNOSIS — F4010 Social phobia, unspecified: Secondary | ICD-10-CM | POA: Diagnosis not present

## 2019-06-22 DIAGNOSIS — F418 Other specified anxiety disorders: Secondary | ICD-10-CM | POA: Diagnosis not present

## 2019-06-22 MED ORDER — SILDENAFIL CITRATE 100 MG PO TABS: 100.0000 mg | ORAL_TABLET | Freq: Every day | ORAL | 3 refills | Status: DC | PRN

## 2019-06-22 NOTE — Progress Notes (Signed)
Derek [name] [id number] 10-29-1965 53 y.o.  Subjective:   Patient ID:  Derek [name] is a 53 y.o. (DOB 1965-09-08) male.  Chief Complaint:  Chief Complaint  Patient presents with  . Follow-up     Medication Management  . Anxiety     Medication Management    Anxiety Symptoms include nervous/anxious behavior. Patient reports no confusion, decreased concentration or suicidal ideas.     Derek [name] presents to the office today for follow-up of anxiety, especially social anxiety disorder.   at visit February 16, 2019.  Because of sexual side effects with paroxetine we switched him to Viibryd.  Also initiated a trial of trial Qbrexxza for excessive sweating.  At visit September 17 after switch from paroxetine to Viibryd 20 mg he noticed a reduction in sexual side effects but an increase in anxiety.  The plan was to increase the Viibryd to 40 mg to get maximum benefit for anxiety.Also trial Qbrexxza for excessive sweating.   Last seen May 11, 2019 on Viibryd 40 mg daily.  He wanted to continue that dose for 6 more weeks to see if he get additional improvement in anxiety.  He also continued on lithium 150 mg daily and propranolol 20 mg twice daily as needed anxiety  Better.  One of the best meds he's ever had.  Not a chance to entirely put it to the test bc is socially isolated.  I need constant reenforcement.  More centered.  Energy is better and more normal.  Other SSRI either revved or blunted energy and feels balanced now. No weight change after the switch from paroxetine.  Trying  To stop sugar.  Sexual SE not a lot better with med change.  Mainly difficulty with erection.  Tried propranolol once with benefit.  When first came in had come off anxiety and had panic and froze in public speaking for 30 min.  Wonders if has PTSD from it and fear of recurrence. Better sleep.  Training in trancendental meditation to help.  Much less anxiety and sweating when meets clients and some  anxiety.  Tried 20mg  and had SE dryness and sexual SE.  Less fixated on public speaking and getting anxious over that.  Has used propranolol for public speaking anxiety for mg daily with some benefit and when flying and this has helped..  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  He is planning planted aquarium  Prior psych meds:  Zoloft 50 cog SE and felt physically revved even after a year and initital insomnia, Lexapro 20, paroxetine helped but sexual SE, Viibryd 40 mg  Review of Systems:  Review of Systems  Constitutional: Negative for unexpected weight change.  Neurological: Negative for tremors and weakness.  Psychiatric/Behavioral: Negative for agitation, behavioral problems, confusion, decreased concentration, dysphoric mood, hallucinations, self-injury, sleep disturbance and suicidal ideas. The patient is nervous/anxious. The patient is not hyperactive.     Medications: I have reviewed the patient's current medications.  Current Outpatient Medications  Medication Sig Dispense Refill  . Ascorbic Acid (VITAMIN C) 1000 MG tablet Take 1,000 mg by mouth daily.    37-10 lithium carbonate 150 MG capsule Take 1 capsule (150 mg total) by mouth daily. 90 capsule 1  . propranolol (INDERAL) 20 MG tablet TAKE ONE TO TWO TABLETS BY MOUTH TWICE A DAY AS NEEDED FOR ANXIETY 100 tablet 1  . Vilazodone HCl (VIIBRYD) 40 MG TABS Take 1 tablet (40 mg total) by mouth daily. 30 tablet 1  . Zinc Sulfate (  ZINC 15 PO) Take by mouth.    . sildenafil (VIAGRA) 100 MG tablet Take 1 tablet (100 mg total) by mouth daily as needed for erectile dysfunction. 30 tablet 3   No current facility-administered medications for this visit.     Medication Side Effects: 1/2 sexual SE.  Allergies: No Known Allergies  Past Medical History:  Diagnosis Date  . Anxiety     Family History  Problem Relation Age of Onset  . Anxiety disorder Father   . Depression Father     Social History    Socioeconomic History  . Marital status: Married    Spouse name: Not on file  . Number of children: Not on file  . Years of education: Not on file  . Highest education level: Not on file  Occupational History  . Not on file  Social Needs  . Financial resource strain: Not on file  . Food insecurity    Worry: Not on file    Inability: Not on file  . Transportation needs    Medical: Not on file    Non-medical: Not on file  Tobacco Use  . Smoking status: Never Smoker  . Smokeless tobacco: Former Engineer, waterUser  Substance and Sexual Activity  . Alcohol use: Yes    Comment: weekly-social  . Drug use: Never  . Sexual activity: Yes    Partners: Female    Birth control/protection: None  Lifestyle  . Physical activity    Days per week: Not on file    Minutes per session: Not on file  . Stress: Not on file  Relationships  . Social Musicianconnections    Talks on phone: Not on file    Gets together: Not on file    Attends religious service: Not on file    Active member of club or organization: Not on file    Attends meetings of clubs or organizations: Not on file    Relationship status: Not on file  . Intimate partner violence    Fear of current or ex partner: Not on file    Emotionally abused: Not on file    Physically abused: Not on file    Forced sexual activity: Not on file  Other Topics Concern  . Not on file  Social History Narrative  . Not on file    Past Medical History, Surgical history, Social history, and Family history were reviewed and updated as appropriate.   Please see review of systems for further details on the patient's review from today.   Objective:   Physical Exam:  There were no vitals taken for this visit.  Physical Exam Constitutional:      General: He is not in acute distress.    Appearance: He is well-developed.  Musculoskeletal:        General: No deformity.  Neurological:     Mental Status: He is alert and oriented to person, place, and time.      Motor: No tremor.     Coordination: Coordination normal.     Gait: Gait normal.  Psychiatric:        Attention and Perception: Attention and perception normal.        Mood and Affect: Mood is anxious. Mood is not depressed. Affect is not labile, blunt, angry, tearful or inappropriate.        Speech: Speech normal.        Behavior: Behavior normal.        Thought Content: Thought content normal. Thought content does not  include homicidal or suicidal ideation. Thought content does not include homicidal or suicidal plan.        Cognition and Memory: Cognition normal.        Judgment: Judgment normal.     Comments: Insight intact. No auditory or visual hallucinations. No delusions.      Lab Review:  No results found for: NA, K, CL, CO2, GLUCOSE, BUN, CREATININE, CALCIUM, PROT, ALBUMIN, AST, ALT, ALKPHOS, BILITOT, GFRNONAA, GFRAA  No results found for: WBC, RBC, HGB, HCT, PLT, MCV, MCH, MCHC, RDW, LYMPHSABS, MONOABS, EOSABS, BASOSABS  No results found for: POCLITH, LITHIUM   No results found for: PHENYTOIN, PHENOBARB, VALPROATE, CBMZ   .res Assessment: Plan:    Derek [name] was seen today for follow-up and anxiety.  Diagnoses and all orders for this visit:  Social anxiety disorder  Performance anxiety  Excessive sweating  Erectile disorder, acquired, generalized, moderate -     sildenafil (VIAGRA) 100 MG tablet; Take 1 tablet (100 mg total) by mouth daily as needed for erectile dysfunction.  Sexual Side effects with Viibryd  Greater than 50% of 30 min face to face time with patient was spent on counseling and coordination of care. We discussed  Overall had good response to the paroxetine with anxiety managed but SE fatigue,  sexual side effects, some blunting and increased appetite.  Switch to Viibryd produced better energy but a recurrence of anxiety while at 20 mg and so far not better with weight, sexual SE.    Disc SE in detail and SSRI withdrawal sx.  He is gotten improved  response from Viibryd 40 mg now that he has been on it about 3 months.  He is satisfied with the effectiveness of the medicine.  He does have residual anxiety.  Answered questions about his concerns over the possible long-term side effects related to antidepressant medications.  Discussed side effects sildenafil.  He will try 50 to 100 mg daily as needed for erectile dysfunction.  Disc option Bz prn panic but disc it's not ideal for performance anxiety dt potential cognitive risks.  Also trial Qbrexxza for excessive sweating.  He hasn't tried it yet.  Disc FDA indications for under arm.  Disc SE.  Disc potential cognitive benefit off label of N-acetylcysteine 600 mg daily.  The propranolol is also helping situational anxiety.  Option clonidine as alternative for sweating.  Lithium for neuroprotections per Dr. Olene Floss.  Continue this.  He is taking it.  FU 3 mos  Lynder Parents, MD, DFAPA    Please see After Visit Summary for patient specific instructions.  No future appointments.  No orders of the defined types were placed in this encounter.     -------------------------------

## 2019-07-15 ENCOUNTER — Other Ambulatory Visit: Payer: Self-pay | Admitting: Psychiatry

## 2019-07-15 DIAGNOSIS — F4010 Social phobia, unspecified: Secondary | ICD-10-CM

## 2019-07-20 ENCOUNTER — Telehealth: Payer: Self-pay

## 2019-07-20 NOTE — Telephone Encounter (Signed)
Prior authorization submitted and approved for Viibryd 40 mg through cover my meds effective 07/18/2019-07/17/2020 with [facility] Plan ID # [id number]

## 2019-08-02 ENCOUNTER — Telehealth: Payer: Self-pay | Admitting: Psychiatry

## 2019-08-02 NOTE — Telephone Encounter (Signed)
Viibryd is no longer being paid by insurance with a $15 copay. Now it is $300. He doesn't want to be paying that much. Is there an alternative or other option?

## 2019-08-03 NOTE — Telephone Encounter (Signed)
Spoke with [facility] and they stated that they cannot run it through until the 27 th when it is due again.

## 2019-08-03 NOTE — Telephone Encounter (Signed)
Let me check into a prior authorization first for him

## 2019-08-04 ENCOUNTER — Telehealth: Payer: Self-pay | Admitting: Psychiatry

## 2019-08-04 NOTE — Telephone Encounter (Signed)
Just sent an updated message to Dr. Jennelle Human. He can also pick up samples till medication can be changed

## 2019-08-04 NOTE — Telephone Encounter (Signed)
He says the Viibryd is too much. We already did a PA on  1/6 and he paid for that RX so he wouldn't run out. He will need to switch medication. He's going to check any alternate meds that are on his plan and get back with [place]. He has only 11 pills left.

## 2019-08-09 NOTE — Telephone Encounter (Signed)
As noted he has tried sertraline, S-Citalopram and paroxetine.  He reports doing well on the Viibryd so we would prefer not to change it.  Please call him and inform him of the problem.  We do not know the size of his medication deductible.  If it small it is to his advantage to stick with what he is taking.  If he has a large medication deductible feels he cannot tolerate it then the next best option is Trintellix because it has lower sexual side effect than the other alternatives offered.  However it is not as good for anxiety typically as is Viibryd.

## 2019-08-10 NOTE — Telephone Encounter (Signed)
Spoke with pt. And he is going to call his insurance company to find out if he has to meet a deductible on Trintellix. He will call back and let me know what they say.

## 2019-09-05 ENCOUNTER — Ambulatory Visit (INDEPENDENT_AMBULATORY_CARE_PROVIDER_SITE_OTHER): Payer: BC Managed Care – PPO | Admitting: Psychiatry

## 2019-09-05 ENCOUNTER — Other Ambulatory Visit: Payer: Self-pay

## 2019-09-05 ENCOUNTER — Encounter: Payer: Self-pay | Admitting: Psychiatry

## 2019-09-05 DIAGNOSIS — F4010 Social phobia, unspecified: Secondary | ICD-10-CM | POA: Diagnosis not present

## 2019-09-05 DIAGNOSIS — F418 Other specified anxiety disorders: Secondary | ICD-10-CM

## 2019-09-05 DIAGNOSIS — R61 Generalized hyperhidrosis: Secondary | ICD-10-CM

## 2019-09-05 DIAGNOSIS — F5221 Male erectile disorder: Secondary | ICD-10-CM

## 2019-09-05 MED ORDER — PAROXETINE HCL 20 MG PO TABS: 20.0000 mg | ORAL_TABLET | Freq: Every day | ORAL | 0 refills | Status: DC

## 2019-09-05 NOTE — Patient Instructions (Addendum)
Disc the off-label use of N-Acetylcysteine at 600 mg daily to help with mild cognitive problems.  It can be combined with a B-complex vitamin as the B-12 and folate have been shown to sometimes enhance the effect.   Start paroxetine 1/2 of 20 mg tablet with 20 mg Viibryd for 1 week,  Then increase paroxetine to 1 daily and reduce Viibryd to 10 mg for 1week, Then stop Viibryd

## 2019-09-05 NOTE — Progress Notes (Signed)
Derek [name] [id number] October 01, 1965 54 y.o.  Subjective:   Patient ID:  Derek [name] is a 54 y.o. (DOB 13-Sep-1965) male.  Chief Complaint:  Chief Complaint  Patient presents with  . Anxiety  . Medication Problem    Viibryd cost    Anxiety Symptoms include nervous/anxious behavior. Patient reports no confusion, decreased concentration or suicidal ideas.     Derek [name] presents to the office today for follow-up of anxiety, especially social anxiety disorder.   at visit February 16, 2019.  Because of sexual side effects with paroxetine we switched him to Viibryd.  Also initiated a trial of trial Qbrexxza for excessive sweating.  At visit September 17 after switch from paroxetine to Viibryd 20 mg he noticed a reduction in sexual side effects but an increase in anxiety.  The plan was to increase the Viibryd to 40 mg to get maximum benefit for anxiety.Also trial Qbrexxza for excessive sweating.   seen May 11, 2019 on Viibryd 40 mg daily.  He wanted to continue that dose for 6 more weeks to see if he get additional improvement in anxiety.  He also continued on lithium 150 mg daily and propranolol 20 mg twice daily as needed anxiety  Last seen December 2020. Doing well except ED and slidenifil Rx.  Good and very pleased with Viagara.  Insurance is no longer covering Viibryd.  Option Trintellix was given.  Got brain zaps with one missed dose of Viibryd and it was frightening.  Anxiety is fairly well controlled except situationally.  Occ use of propranolol.   Other SSRI either revved or blunted energy and feels balanced now.  Not going to gym Dt Covid.   Tried propranolol once with benefit.  When first came in had come off anxiety and had panic and froze in public speaking for 30 min.  Wonders if has PTSD from it and fear of recurrence. Better sleep.  Training in trancendental meditation to help.  Much less anxiety and sweating when meets clients and some anxiety.  Tried 20mg  and had  SE dryness and sexual SE.  Less fixated on public speaking and getting anxious over that.  Has used propranolol for public speaking anxiety for mg daily with some benefit and when flying and this has helped..  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   He is planning planted aquarium  Prior psych meds:  Zoloft 50 cog SE and felt physically revved even after a year and initital insomnia, Lexapro 20, paroxetine helped but sexual SE, Viibryd 40 mg, fluoxetine  Review of Systems:  Review of Systems  Constitutional: Negative for unexpected weight change.  Neurological: Negative for tremors and weakness.  Psychiatric/Behavioral: Negative for agitation, behavioral problems, confusion, decreased concentration, dysphoric mood, hallucinations, self-injury, sleep disturbance and suicidal ideas. The patient is nervous/anxious. The patient is not hyperactive.     Medications: I have reviewed the patient's current medications.  Current Outpatient Medications  Medication Sig Dispense Refill  . Ascorbic Acid (VITAMIN C) 1000 MG tablet Take 1,000 mg by mouth daily.    33-35 lithium carbonate 150 MG capsule Take 1 capsule (150 mg total) by mouth daily. 90 capsule 1  . propranolol (INDERAL) 20 MG tablet TAKE ONE TO TWO TABLETS BY MOUTH TWICE A DAY AS NEEDED FOR ANXIETY 100 tablet 1  . sildenafil (VIAGRA) 100 MG tablet Take 1 tablet (100 mg total) by mouth daily as needed for erectile dysfunction. 30 tablet 3  . VIIBRYD 40 MG TABS TAKE ONE  TABLET BY MOUTH DAILY 30 tablet 5  . Zinc Sulfate (ZINC 15 PO) Take by mouth.    [name] PARoxetine (PAXIL) 20 MG tablet Take 1 tablet (20 mg total) by mouth daily. 90 tablet 0   No current facility-administered medications for this visit.    Medication Side Effects: 1/2 sexual SE.  Allergies: No Known Allergies  Past Medical History:  Diagnosis Date  . Anxiety     Family History  Problem Relation Age of Onset  . Anxiety disorder Father   .  Depression Father     Social History   Socioeconomic History  . Marital status: Married    Spouse name: Not on file  . Number of children: Not on file  . Years of education: Not on file  . Highest education level: Not on file  Occupational History  . Not on file  Tobacco Use  . Smoking status: Never Smoker  . Smokeless tobacco: Former Engineer, water and Sexual Activity  . Alcohol use: Yes    Comment: weekly-social  . Drug use: Never  . Sexual activity: Yes    Partners: Female    Birth control/protection: None  Other Topics Concern  . Not on file  Social History Narrative  . Not on file   Social Determinants of Health   Financial Resource Strain:   . Difficulty of Paying Living Expenses: Not on file  Food Insecurity:   . Worried About Programme researcher, broadcasting/film/video in the Last Year: Not on file  . Ran Out of Food in the Last Year: Not on file  Transportation Needs:   . Lack of Transportation (Medical): Not on file  . Lack of Transportation (Non-Medical): Not on file  Physical Activity:   . Days of Exercise per Week: Not on file  . Minutes of Exercise per Session: Not on file  Stress:   . Feeling of Stress : Not on file  Social Connections:   . Frequency of Communication with Friends and Family: Not on file  . Frequency of Social Gatherings with Friends and Family: Not on file  . Attends Religious Services: Not on file  . Active Member of Clubs or Organizations: Not on file  . Attends Banker Meetings: Not on file  . Marital Status: Not on file  Intimate Partner Violence:   . Fear of Current or Ex-Partner: Not on file  . Emotionally Abused: Not on file  . Physically Abused: Not on file  . Sexually Abused: Not on file    Past Medical History, Surgical history, Social history, and Family history were reviewed and updated as appropriate.   Please see review of systems for further details on the patient's review from today.   Objective:   Physical Exam:   There were no vitals taken for this visit.  Physical Exam Constitutional:      General: He is not in acute distress.    Appearance: He is well-developed.  Musculoskeletal:        General: No deformity.  Neurological:     Mental Status: He is alert and oriented to person, place, and time.     Motor: No tremor.     Coordination: Coordination normal.     Gait: Gait normal.  Psychiatric:        Attention and Perception: Attention and perception normal.        Mood and Affect: Mood is anxious. Mood is not depressed. Affect is not labile, blunt, angry, tearful or inappropriate.  Speech: Speech normal.        Behavior: Behavior normal.        Thought Content: Thought content normal. Thought content does not include homicidal or suicidal ideation. Thought content does not include homicidal or suicidal plan.        Cognition and Memory: Cognition normal.        Judgment: Judgment normal.     Comments: Insight intact. No auditory or visual hallucinations. No delusions.      Lab Review:  No results found for: NA, K, CL, CO2, GLUCOSE, BUN, CREATININE, CALCIUM, PROT, ALBUMIN, AST, ALT, ALKPHOS, BILITOT, GFRNONAA, GFRAA  No results found for: WBC, RBC, HGB, HCT, PLT, MCV, MCH, MCHC, RDW, LYMPHSABS, MONOABS, EOSABS, BASOSABS  No results found for: POCLITH, LITHIUM   No results found for: PHENYTOIN, PHENOBARB, VALPROATE, CBMZ   .res Assessment: Plan:    Derek [name] was seen today for anxiety and medication problem.  Diagnoses and all orders for this visit:  Social anxiety disorder -     PARoxetine (PAXIL) 20 MG tablet; Take 1 tablet (20 mg total) by mouth daily.  Performance anxiety -     PARoxetine (PAXIL) 20 MG tablet; Take 1 tablet (20 mg total) by mouth daily.  Excessive sweating  Erectile disorder, acquired, generalized, moderate  Sexual Side effects with Viibryd  Greater than 50% of 30 min face to face time with patient was spent on counseling and coordination of care.  We discussed  Overall had good response to the paroxetine with anxiety managed but SE fatigue,  sexual side effects, some blunting and increased appetite.  Switch to Viibryd produced better energy but a recurrence of anxiety while at 20 mg and so far not better with weight, sexual SE.    Disc SE in detail and SSRI withdrawal sx.  He is gotten improved response from Viibryd 40 mg now that he has been on it about 3 months.  A little more rumination with Viibryd.   He does have residual anxiety.   Viibryd is no longer covered by insurance.  Disc Trintellix but in general it's not as effective for anxiety typically with anxiety.  DT costs must change.  Option fluoxetine or return to paroxetine +/- buspirone.   He's ambialent about which SSRI.   Return to paroxetine alone 20 mg per his choice.  Answered questions about his concerns over the possible long-term side effects related to antidepressant medications.  Discussed side effects sildenafil.  He will try 50 to 100 mg daily as needed for erectile dysfunction.  Disc option Bz prn panic but disc it's not ideal for performance anxiety dt potential cognitive risks.  Also trial Qbrexxza for excessive sweating.  He hasn't tried it yet.  Disc FDA indications for under arm.  Disc SE.  Disc potential cognitive benefit off label of N-acetylcysteine 600 mg daily.   The propranolol is also helping situational anxiety.  Use it more.    Option clonidine as alternative for sweating.  Lithium for neuroprotections per Dr. Olene Floss.  Continue this.  He is taking it.  FU 3 mos  Lynder Parents, MD, DFAPA    Please see After Visit Summary for patient specific instructions.  No future appointments.  No orders of the defined types were placed in this encounter.     -------------------------------

## 2019-09-21 ENCOUNTER — Ambulatory Visit: Payer: BC Managed Care – PPO | Admitting: Psychiatry

## 2019-10-16 ENCOUNTER — Other Ambulatory Visit: Payer: Self-pay | Admitting: Psychiatry

## 2019-11-02 ENCOUNTER — Other Ambulatory Visit: Payer: Self-pay

## 2019-11-02 ENCOUNTER — Encounter: Payer: Self-pay | Admitting: Psychiatry

## 2019-11-02 ENCOUNTER — Ambulatory Visit (INDEPENDENT_AMBULATORY_CARE_PROVIDER_SITE_OTHER): Payer: BC Managed Care – PPO | Admitting: Psychiatry

## 2019-11-02 DIAGNOSIS — F4010 Social phobia, unspecified: Secondary | ICD-10-CM

## 2019-11-02 DIAGNOSIS — F418 Other specified anxiety disorders: Secondary | ICD-10-CM

## 2019-11-02 DIAGNOSIS — F5221 Male erectile disorder: Secondary | ICD-10-CM

## 2019-11-02 DIAGNOSIS — R61 Generalized hyperhidrosis: Secondary | ICD-10-CM

## 2019-11-02 MED ORDER — LITHIUM CARBONATE 150 MG PO CAPS: 150.0000 mg | ORAL_CAPSULE | Freq: Every day | ORAL | 3 refills | Status: DC

## 2019-11-02 MED ORDER — PAROXETINE HCL 20 MG PO TABS: 20.0000 mg | ORAL_TABLET | Freq: Every day | ORAL | 0 refills | Status: DC

## 2019-11-02 NOTE — Progress Notes (Signed)
Derek [name] [id number] [birth year] 54 y.o.  Subjective:   Patient ID:  Derek [name] is a 54 y.o. (DOB [birth year]) male.  Chief Complaint:  Chief Complaint  Patient presents with  . Follow-up    med change    Anxiety Symptoms include nervous/anxious behavior. Patient reports no confusion, decreased concentration or suicidal ideas.     Derek [name] presents to the office today for follow-up of anxiety, especially social anxiety disorder.   at visit February 16, 2019.  Because of sexual side effects with paroxetine we switched him to Viibryd.  Also initiated a trial of trial Qbrexxza for excessive sweating.  At visit September 17 after switch from paroxetine to Viibryd 20 mg he noticed a reduction in sexual side effects but an increase in anxiety.  The plan was to increase the Viibryd to 40 mg to get maximum benefit for anxiety.Also trial Qbrexxza for excessive sweating.   seen May 11, 2019 on Viibryd 40 mg daily.  He wanted to continue that dose for 6 more weeks to see if he get additional improvement in anxiety.  He also continued on lithium 150 mg daily and propranolol 20 mg twice daily as needed anxiety  seen December 2020. Doing well except ED and slidenifil Rx.  Last seen September 05, 2019.  The following was noted: Good and very pleased with Viagara.  Insurance is no longer covering Viibryd.  Option Trintellix was given.  Got brain zaps with one missed dose of Viibryd and it was frightening. Anxiety is fairly well controlled except situationally.  Occ use of propranolol.   Other SSRI either revved or blunted energy and feels balanced now.  Not going to gym Dt Covid.  Tried propranolol once with benefit. DT costs must change.  Option fluoxetine or return to paroxetine +/- buspirone.   He's ambialent about which SSRI.   Return to paroxetine alone 20 mg per his choice.  November 02, 2019, appointment, the following is noted: It feels good and familiar. Sleep is good.  Feels a  little more sedate than in the past.  Doing transcendental meditation will nod off more than before.  A little better for rumination and worry.  Jaw and dry mouth is a little worse.  Sex SE anorgasmia.  Viagra helps erection. [name] detox.  Feels a lot more centered and more clear with the diet.  No alcohol nor sugar.  Anxiety is managed.  No panic since here.    When first came in had come off anxiety and had panic and froze in public speaking for 30 min.  Wonders if has PTSD from it and fear of recurrence. Better sleep.  Training in trancendental meditation to help.  Much less anxiety and sweating when meets clients and some anxiety.  Tried 20mg  and had SE dryness and sexual SE.  Less fixated on public speaking and getting anxious over that.  Has used propranolol for public speaking anxiety for mg daily with some benefit and when flying and this has helped..  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   He is planning planted aquarium  Prior psych meds:  Zoloft 50 cog SE and felt physically revved even after a year and initital insomnia,  Lexapro 20, Viibryd 40 mg, fluoxetine paroxetine helped but sexual SE,   Review of Systems:  Review of Systems  Constitutional: Negative for unexpected weight change.  Gastrointestinal: Positive for diarrhea.  Neurological: Negative for tremors and weakness.  Psychiatric/Behavioral: Negative for agitation, behavioral problems, confusion,  decreased concentration, dysphoric mood, hallucinations, self-injury, sleep disturbance and suicidal ideas. The patient is nervous/anxious. The patient is not hyperactive.     Medications: I have reviewed the patient's current medications.  Current Outpatient Medications  Medication Sig Dispense Refill  . Acetylcysteine 600 MG CAPS Take 600 mg by mouth daily.    . Ascorbic Acid (VITAMIN C) 1000 MG tablet Take 1,000 mg by mouth daily.    [name] lithium carbonate 150 MG capsule Take 1 capsule (150 mg  total) by mouth daily. 90 capsule 3  . PARoxetine (PAXIL) 20 MG tablet Take 1 tablet (20 mg total) by mouth daily. 90 tablet 0  . propranolol (INDERAL) 20 MG tablet TAKE ONE TO TWO TABLETS BY MOUTH TWICE A DAY AS NEEDED FOR ANXIETY 100 tablet 1  . sildenafil (VIAGRA) 100 MG tablet Take 1 tablet (100 mg total) by mouth daily as needed for erectile dysfunction. 30 tablet 3  . Zinc Sulfate (ZINC 15 PO) Take by mouth.     No current facility-administered medications for this visit.    Medication Side Effects: 1/2 sexual SE.  Allergies: No Known Allergies  Past Medical History:  Diagnosis Date  . Anxiety     Family History  Problem Relation Age of Onset  . Anxiety disorder Father   . Depression Father     Social History   Socioeconomic History  . Marital status: Married    Spouse name: Not on file  . Number of children: Not on file  . Years of education: Not on file  . Highest education level: Not on file  Occupational History  . Not on file  Tobacco Use  . Smoking status: Never Smoker  . Smokeless tobacco: Former Engineer, water and Sexual Activity  . Alcohol use: Yes    Comment: weekly-social  . Drug use: Never  . Sexual activity: Yes    Partners: Female    Birth control/protection: None  Other Topics Concern  . Not on file  Social History Narrative  . Not on file   Social Determinants of Health   Financial Resource Strain:   . Difficulty of Paying Living Expenses:   Food Insecurity:   . Worried About Programme researcher, broadcasting/film/video in the Last Year:   . Barista in the Last Year:   Transportation Needs:   . Freight forwarder (Medical):   [name] Lack of Transportation (Non-Medical):   Physical Activity:   . Days of Exercise per Week:   . Minutes of Exercise per Session:   Stress:   . Feeling of Stress :   Social Connections:   . Frequency of Communication with Friends and Family:   . Frequency of Social Gatherings with Friends and Family:   . Attends  Religious Services:   . Active Member of Clubs or Organizations:   . Attends Banker Meetings:   [name] Marital Status:   Intimate Partner Violence:   . Fear of Current or Ex-Partner:   . Emotionally Abused:   [name] Physically Abused:   . Sexually Abused:     Past Medical History, Surgical history, Social history, and Family history were reviewed and updated as appropriate.   Please see review of systems for further details on the patient's review from today.   Objective:   Physical Exam:  There were no vitals taken for this visit.  Physical Exam Constitutional:      General: He is not in acute distress.    Appearance: He  is well-developed.  Musculoskeletal:        General: No deformity.  Neurological:     Mental Status: He is alert and oriented to person, place, and time.     Motor: No tremor.     Coordination: Coordination normal.     Gait: Gait normal.  Psychiatric:        Attention and Perception: Attention and perception normal.        Mood and Affect: Mood is anxious. Mood is not depressed. Affect is not labile, blunt, angry, tearful or inappropriate.        Speech: Speech normal.        Behavior: Behavior normal.        Thought Content: Thought content normal. Thought content does not include homicidal or suicidal ideation. Thought content does not include homicidal or suicidal plan.        Cognition and Memory: Cognition normal.        Judgment: Judgment normal.     Comments: Insight intact. No auditory or visual hallucinations. No delusions.      Lab Review:  No results found for: NA, K, CL, CO2, GLUCOSE, BUN, CREATININE, CALCIUM, PROT, ALBUMIN, AST, ALT, ALKPHOS, BILITOT, GFRNONAA, GFRAA  No results found for: WBC, RBC, HGB, HCT, PLT, MCV, MCH, MCHC, RDW, LYMPHSABS, MONOABS, EOSABS, BASOSABS  No results found for: POCLITH, LITHIUM   No results found for: PHENYTOIN, PHENOBARB, VALPROATE, CBMZ   .res Assessment: Plan:    Imer was seen today for  follow-up.  Diagnoses and all orders for this visit:  Social anxiety disorder -     PARoxetine (PAXIL) 20 MG tablet; Take 1 tablet (20 mg total) by mouth daily.  Performance anxiety -     PARoxetine (PAXIL) 20 MG tablet; Take 1 tablet (20 mg total) by mouth daily.  Excessive sweating  Erectile disorder, acquired, generalized, moderate  Other orders -     lithium carbonate 150 MG capsule; Take 1 capsule (150 mg total) by mouth daily.  Sexual Side effects with Viibryd  Greater than 50% of 30 min face to face time with patient was spent on counseling and coordination of care. We discussed  Overall had good response to the paroxetine with anxiety managed but SE fatigue,  sexual side effects, some blunting and increased appetite.  Switch to Viibryd produced better energy but a recurrence of anxiety while at 20 mg and so far not better with weight, sexual SE.    Disc SE in detail and SSRI withdrawal sx.  He is gotten improved response from Viibryd 40 mg now that he has been on it about 3 months.  A little more rumination with Viibryd.   He does have residual anxiety.   Viibryd is no longer covered by insurance.  Disc Trintellix but in general it's not as effective for anxiety typically with anxiety.  DT costs must change.  Option fluoxetine or return to paroxetine +/- buspirone.   He's ambialent about which SSRI.   Continue paroxetine alone 20 mg per his choice but switch to HS.  Disc drug holiday for sexual SE.  Answered questions about his concerns over the possible long-term side effects related to antidepressant medications.  Discussed side effects sildenafil.  He will try 50 to 100 mg daily as needed for erectile dysfunction.  Disc option Bz prn panic but disc it's not ideal for performance anxiety dt potential cognitive risks.  Also trial Qbrexxza for excessive sweating.  He hasn't tried it yet.  Disc FDA  indications for under arm.  Disc SE.  Disc potential cognitive benefit off  label of N-acetylcysteine 600 mg daily.   The propranolol is also helping situational anxiety.  Use it more.    Option clonidine as alternative for sweating.  No med changes today.  FU  3 mos  Lynder Parents, MD, DFAPA    Please see After Visit Summary for patient specific instructions.  No future appointments.  No orders of the defined types were placed in this encounter.     -------------------------------

## 2020-02-01 ENCOUNTER — Ambulatory Visit (INDEPENDENT_AMBULATORY_CARE_PROVIDER_SITE_OTHER): Payer: BC Managed Care – PPO | Admitting: Psychiatry

## 2020-02-01 ENCOUNTER — Encounter: Payer: Self-pay | Admitting: Psychiatry

## 2020-02-01 ENCOUNTER — Other Ambulatory Visit: Payer: Self-pay

## 2020-02-01 DIAGNOSIS — F5221 Male erectile disorder: Secondary | ICD-10-CM

## 2020-02-01 DIAGNOSIS — R61 Generalized hyperhidrosis: Secondary | ICD-10-CM

## 2020-02-01 DIAGNOSIS — F4010 Social phobia, unspecified: Secondary | ICD-10-CM

## 2020-02-01 DIAGNOSIS — F418 Other specified anxiety disorders: Secondary | ICD-10-CM | POA: Diagnosis not present

## 2020-02-01 MED ORDER — PROPRANOLOL HCL 20 MG PO TABS: ORAL_TABLET | ORAL | 1 refills | Status: DC

## 2020-02-01 MED ORDER — PAROXETINE HCL 20 MG PO TABS: 20.0000 mg | ORAL_TABLET | Freq: Every day | ORAL | 1 refills | Status: DC

## 2020-02-01 MED ORDER — SILDENAFIL CITRATE 100 MG PO TABS: 100.0000 mg | ORAL_TABLET | Freq: Every day | ORAL | 3 refills | Status: DC | PRN

## 2020-02-01 NOTE — Progress Notes (Signed)
Derek [name] [id number] [birth year] 54 y.o.  Subjective:   Patient ID:  Derek [name] is a 54 y.o. (DOB 1965/09/29) male.  Chief Complaint:  Chief Complaint  Patient presents with  . Follow-up  . Anxiety    HPI Derek [name] presents to the office today for follow-up of anxiety, especially social anxiety disorder.   at visit February 16, 2019.  Because of sexual side effects with paroxetine we switched him to Viibryd.  Also initiated a trial of trial Qbrexxza for excessive sweating.  At visit September 17 after switch from paroxetine to Viibryd 20 mg he noticed a reduction in sexual side effects but an increase in anxiety.  The plan was to increase the Viibryd to 40 mg to get maximum benefit for anxiety.Also trial Qbrexxza for excessive sweating.   seen May 11, 2019 on Viibryd 40 mg daily.  He wanted to continue that dose for 6 more weeks to see if he get additional improvement in anxiety.  He also continued on lithium 150 mg daily and propranolol 20 mg twice daily as needed anxiety  seen December 2020. Doing well except ED and slidenifil Rx.  Last seen September 05, 2019.  The following was noted: Good and very pleased with Viagara.  Insurance is no longer covering Viibryd.  Option Trintellix was given.  Got brain zaps with one missed dose of Viibryd and it was frightening. Anxiety is fairly well controlled except situationally.  Occ use of propranolol.   Other SSRI either revved or blunted energy and feels balanced now.  Not going to gym Dt Covid.  Tried propranolol once with benefit. DT costs must change.  Option fluoxetine or return to paroxetine +/- buspirone.   He's ambialent about which SSRI.   Return to paroxetine alone 20 mg per his choice.  November 02, 2019, appointment, the following is noted: It feels good and familiar. Sleep is good.  Feels a little more sedate than in the past.  Doing transcendental meditation will nod off more than before.  A little better for  rumination and worry.  Jaw and dry mouth is a little worse.  Sex SE anorgasmia.  Viagra helps erection. [name] detox.  Feels a lot more centered and more clear with the diet.  No alcohol nor sugar.  Anxiety is managed.  No panic since here.    When first came in had come off anxiety and had panic and froze in public speaking for 30 min.  Wonders if has PTSD from it and fear of recurrence. Better sleep.  Training in trancendental meditation to help.  02/01/20 appt with the following noted:   Still pleased with paroxetine and using propranolol. Overall good response.    Sleeping fine.  Not depressed.   SE manageable.  Much less anxiety and sweating when meets clients and some anxiety.  Tried 20mg  and had SE dryness and sexual SE.  Less fixated on public speaking and getting anxious over that.  Has used propranolol for public speaking anxiety for mg daily with some benefit and when flying and this has helped..  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   He is planning planted aquarium  Prior psych meds:  Zoloft 50 cog SE and felt physically revved even after a year and initital insomnia,  Lexapro 20, Viibryd 40 mg, fluoxetine paroxetine helped but sexual SE,    Review of Systems:  Review of Systems  Musculoskeletal: Positive for arthralgias.  Neurological: Negative for tremors and weakness.  Medications: I have reviewed the patient's current medications.  Current Outpatient Medications  Medication Sig Dispense Refill  . Acetylcysteine 600 MG CAPS Take 600 mg by mouth daily.    . Ascorbic Acid (VITAMIN C) 1000 MG tablet Take 1,000 mg by mouth daily.    [name] lithium carbonate 150 MG capsule Take 1 capsule (150 mg total) by mouth daily. 90 capsule 3  . PARoxetine (PAXIL) 20 MG tablet Take 1 tablet (20 mg total) by mouth daily. 90 tablet 0  . propranolol (INDERAL) 20 MG tablet TAKE ONE TO TWO TABLETS BY MOUTH TWICE A DAY AS NEEDED FOR ANXIETY 100 tablet 1  .  sildenafil (VIAGRA) 100 MG tablet Take 1 tablet (100 mg total) by mouth daily as needed for erectile dysfunction. 30 tablet 3  . Zinc Sulfate (ZINC 15 PO) Take by mouth.     No current facility-administered medications for this visit.    Medication Side Effects: Other: Sexual side effects manageable  Allergies: No Known Allergies  Past Medical History:  Diagnosis Date  . Anxiety     Family History  Problem Relation Age of Onset  . Anxiety disorder Father   . Depression Father     Social History   Socioeconomic History  . Marital status: Married    Spouse name: Not on file  . Number of children: Not on file  . Years of education: Not on file  . Highest education level: Not on file  Occupational History  . Not on file  Tobacco Use  . Smoking status: Never Smoker  . Smokeless tobacco: Former Engineer, water and Sexual Activity  . Alcohol use: Yes    Comment: weekly-social  . Drug use: Never  . Sexual activity: Yes    Partners: Female    Birth control/protection: None  Other Topics Concern  . Not on file  Social History Narrative  . Not on file   Social Determinants of Health   Financial Resource Strain:   . Difficulty of Paying Living Expenses:   Food Insecurity:   . Worried About Programme researcher, broadcasting/film/video in the Last Year:   . Barista in the Last Year:   Transportation Needs:   . Freight forwarder (Medical):   [name] Lack of Transportation (Non-Medical):   Physical Activity:   . Days of Exercise per Week:   . Minutes of Exercise per Session:   Stress:   . Feeling of Stress :   Social Connections:   . Frequency of Communication with Friends and Family:   . Frequency of Social Gatherings with Friends and Family:   . Attends Religious Services:   . Active Member of Clubs or Organizations:   . Attends Banker Meetings:   [name] Marital Status:   Intimate Partner Violence:   . Fear of Current or Ex-Partner:   . Emotionally Abused:   [name]  Physically Abused:   . Sexually Abused:     Past Medical History, Surgical history, Social history, and Family history were reviewed and updated as appropriate.   Please see review of systems for further details on the patient's review from today.   Objective:   Physical Exam:  There were no vitals taken for this visit.  Physical Exam Constitutional:      General: He is not in acute distress. Musculoskeletal:        General: No deformity.  Neurological:     Mental Status: He is alert and oriented to person, place,  and time.     Coordination: Coordination normal.  Psychiatric:        Attention and Perception: Attention and perception normal. He does not perceive auditory or visual hallucinations.        Mood and Affect: Mood is not anxious or depressed. Affect is not labile, blunt, angry or inappropriate.        Speech: Speech normal.        Behavior: Behavior normal.        Thought Content: Thought content normal. Thought content is not paranoid or delusional. Thought content does not include homicidal or suicidal ideation. Thought content does not include homicidal or suicidal plan.        Cognition and Memory: Cognition and memory normal.        Judgment: Judgment normal.     Comments: Insight intact Anxiety manageable.     Lab Review:  No results found for: NA, K, CL, CO2, GLUCOSE, BUN, CREATININE, CALCIUM, PROT, ALBUMIN, AST, ALT, ALKPHOS, BILITOT, GFRNONAA, GFRAA  No results found for: WBC, RBC, HGB, HCT, PLT, MCV, MCH, MCHC, RDW, LYMPHSABS, MONOABS, EOSABS, BASOSABS  No results found for: POCLITH, LITHIUM   No results found for: PHENYTOIN, PHENOBARB, VALPROATE, CBMZ   .res Assessment: Plan:    Sufyaan was seen today for follow-up and anxiety.  Diagnoses and all orders for this visit:  Social anxiety disorder  Performance anxiety  Excessive sweating  Erectile disorder, acquired, generalized, moderate    No change indicated with good response.  Overall  better response with paroxetine then other meds tried as noted above.  Still has some sexual side effects but they are manageable.  High relapse risk of stops medication.  He is agreeable to continuing the meds without change. Continue paroxetine and propranolol.  Don't need sildenafil all the time.  FU 6 mos  Meredith Staggers, MD, DFAPA   Please see After Visit Summary for patient specific instructions.  No future appointments.  No orders of the defined types were placed in this encounter.   -------------------------------

## 2020-08-01 ENCOUNTER — Ambulatory Visit (INDEPENDENT_AMBULATORY_CARE_PROVIDER_SITE_OTHER): Payer: BC Managed Care – PPO | Admitting: Psychiatry

## 2020-08-01 ENCOUNTER — Other Ambulatory Visit: Payer: Self-pay

## 2020-08-01 ENCOUNTER — Encounter: Payer: Self-pay | Admitting: Psychiatry

## 2020-08-01 DIAGNOSIS — F4010 Social phobia, unspecified: Secondary | ICD-10-CM | POA: Diagnosis not present

## 2020-08-01 DIAGNOSIS — F5221 Male erectile disorder: Secondary | ICD-10-CM | POA: Diagnosis not present

## 2020-08-01 DIAGNOSIS — F418 Other specified anxiety disorders: Secondary | ICD-10-CM

## 2020-08-01 MED ORDER — PAROXETINE HCL 30 MG PO TABS: 15.0000 mg | ORAL_TABLET | Freq: Every day | ORAL | 1 refills | Status: DC

## 2020-08-01 MED ORDER — PROPRANOLOL HCL 20 MG PO TABS: ORAL_TABLET | ORAL | 1 refills | Status: DC

## 2020-08-01 NOTE — Progress Notes (Signed)
Derek [name] [id number] [birth year] 55 y.o.  Subjective:   Patient ID:  Derek [name] is a 55 y.o. (DOB Apr 09, 1966) male.  Chief Complaint:  Chief Complaint  Patient presents with  . Follow-up  . Medication Problem    HPI Derek [name] presents to the office today for follow-up of anxiety, especially social anxiety disorder.   at visit February 16, 2019.  Because of sexual side effects with paroxetine we switched him to Viibryd.  Also initiated a trial of trial Qbrexxza for excessive sweating.  At visit September 17 after switch from paroxetine to Viibryd 20 mg he noticed a reduction in sexual side effects but an increase in anxiety.  The plan was to increase the Viibryd to 40 mg to get maximum benefit for anxiety.Also trial Qbrexxza for excessive sweating.   seen May 11, 2019 on Viibryd 40 mg daily.  He wanted to continue that dose for 6 more weeks to see if he get additional improvement in anxiety.  He also continued on lithium 150 mg daily and propranolol 20 mg twice daily as needed anxiety  seen December 2020. Doing well except ED and slidenifil Rx.  Last seen September 05, 2019.  The following was noted: Good and very pleased with Viagara.  Insurance is no longer covering Viibryd.  Option Trintellix was given.  Got brain zaps with one missed dose of Viibryd and it was frightening. Anxiety is fairly well controlled except situationally.  Occ use of propranolol.   Other SSRI either revved or blunted energy and feels balanced now.  Not going to gym Dt Covid.  Tried propranolol once with benefit. DT costs must change.  Option fluoxetine or return to paroxetine +/- buspirone.   He's ambialent about which SSRI.   Return to paroxetine alone 20 mg per his choice.  November 02, 2019, appointment, the following is noted: It feels good and familiar. Sleep is good.  Feels a little more sedate than in the past.  Doing transcendental meditation will nod off more than before.  A little better  for rumination and worry.  Jaw and dry mouth is a little worse.  Sex SE anorgasmia.  Viagra helps erection. [name] detox.  Feels a lot more centered and more clear with the diet.  No alcohol nor sugar.  Anxiety is managed.  No panic since here.    When first came in had come off anxiety and had panic and froze in public speaking for 30 min.  Wonders if has PTSD from it and fear of recurrence. Better sleep.  Training in trancendental meditation to help.  02/01/20 appt with the following noted:   Still pleased with paroxetine and using propranolol. Overall good response.    Sleeping fine.  Not depressed.   SE manageable. Plan no changes  08/01/2020 appt noted: Good overall and anxiety managed.  Dry mouth noticeable and also constipating..Paroxetine working well.  Much less anxiety and sweating when meets clients and some anxiety.  Tried 20mg  and had SE dryness and sexual SE.  Less fixated on public speaking and getting anxious over that.  Has used propranolol for public speaking anxiety for mg daily with some benefit and when flying and this has helped..  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   He is planning planted aquarium  Prior psych meds:  Zoloft 50 cog SE and felt physically revved even after a year and initital insomnia,  Lexapro 20, Viibryd 40 mg, fluoxetine paroxetine helped but sexual SE and dryness,  Review of Systems:  Review of Systems  HENT:       Dryness  Musculoskeletal: Positive for arthralgias.  Neurological: Negative for tremors and weakness.    Medications: I have reviewed the patient's current medications.  Current Outpatient Medications  Medication Sig Dispense Refill  . Acetylcysteine 600 MG CAPS Take 600 mg by mouth daily.    . Ascorbic Acid (VITAMIN C) 1000 MG tablet Take 1,000 mg by mouth daily.    [name] lithium carbonate 150 MG capsule Take 1 capsule (150 mg total) by mouth daily. 90 capsule 3  . sildenafil (VIAGRA) 100 MG  tablet Take 1 tablet (100 mg total) by mouth daily as needed for erectile dysfunction. 30 tablet 3  . Zinc Sulfate (ZINC 15 PO) Take by mouth.    [name] PARoxetine (PAXIL) 30 MG tablet Take 0.5 tablets (15 mg total) by mouth daily. 45 tablet 1  . propranolol (INDERAL) 20 MG tablet TAKE ONE TO TWO TABLETS BY MOUTH TWICE A DAY AS NEEDED FOR ANXIETY 100 tablet 1   No current facility-administered medications for this visit.    Medication Side Effects: Other: Sexual side effects manageable  Allergies: No Known Allergies  Past Medical History:  Diagnosis Date  . Anxiety     Family History  Problem Relation Age of Onset  . Anxiety disorder Father   . Depression Father     Social History   Socioeconomic History  . Marital status: Married    Spouse name: Not on file  . Number of children: Not on file  . Years of education: Not on file  . Highest education level: Not on file  Occupational History  . Not on file  Tobacco Use  . Smoking status: Never Smoker  . Smokeless tobacco: Former Engineer, water and Sexual Activity  . Alcohol use: Yes    Comment: weekly-social  . Drug use: Never  . Sexual activity: Yes    Partners: Female    Birth control/protection: None  Other Topics Concern  . Not on file  Social History Narrative  . Not on file   Social Determinants of Health   Financial Resource Strain: Not on file  Food Insecurity: Not on file  Transportation Needs: Not on file  Physical Activity: Not on file  Stress: Not on file  Social Connections: Not on file  Intimate Partner Violence: Not on file    Past Medical History, Surgical history, Social history, and Family history were reviewed and updated as appropriate.   Please see review of systems for further details on the patient's review from today.   Objective:   Physical Exam:  There were no vitals taken for this visit.  Physical Exam Constitutional:      General: He is not in acute distress. Musculoskeletal:         General: No deformity.  Neurological:     Mental Status: He is alert and oriented to person, place, and time.     Coordination: Coordination normal.  Psychiatric:        Attention and Perception: Attention and perception normal. He does not perceive auditory or visual hallucinations.        Mood and Affect: Mood is not anxious or depressed. Affect is not labile, blunt, angry or inappropriate.        Speech: Speech normal.        Behavior: Behavior normal.        Thought Content: Thought content normal. Thought content is not paranoid or delusional.  Thought content does not include homicidal or suicidal ideation. Thought content does not include homicidal or suicidal plan.        Cognition and Memory: Cognition and memory normal.        Judgment: Judgment normal.     Comments: Insight intact Anxiety manageable.     Lab Review:  No results found for: NA, K, CL, CO2, GLUCOSE, BUN, CREATININE, CALCIUM, PROT, ALBUMIN, AST, ALT, ALKPHOS, BILITOT, GFRNONAA, GFRAA  No results found for: WBC, RBC, HGB, HCT, PLT, MCV, MCH, MCHC, RDW, LYMPHSABS, MONOABS, EOSABS, BASOSABS  No results found for: POCLITH, LITHIUM   No results found for: PHENYTOIN, PHENOBARB, VALPROATE, CBMZ   .res Assessment: Plan:    Ona was seen today for follow-up and medication problem.  Diagnoses and all orders for this visit:  Social anxiety disorder -     PARoxetine (PAXIL) 30 MG tablet; Take 0.5 tablets (15 mg total) by mouth daily. -     propranolol (INDERAL) 20 MG tablet; TAKE ONE TO TWO TABLETS BY MOUTH TWICE A DAY AS NEEDED FOR ANXIETY  Performance anxiety -     PARoxetine (PAXIL) 30 MG tablet; Take 0.5 tablets (15 mg total) by mouth daily. -     propranolol (INDERAL) 20 MG tablet; TAKE ONE TO TWO TABLETS BY MOUTH TWICE A DAY AS NEEDED FOR ANXIETY  Erectile disorder, acquired, generalized, moderate    No change indicated with good response.  Overall better response with paroxetine then other meds  tried as noted above.  Still has some sexual side effects but they are manageable.  High relapse risk of stops medication.  He is agreeable to continuing the meds without change. Continue propranolol.  Reduce paroxetine to 15 mg daily bc dryness and anxiety managed for months.  Don't need sildenafil all the time. It causes congestion.  FU 6 mos  Meredith Staggers, MD, DFAPA   Please see After Visit Summary for patient specific instructions.  No future appointments.  No orders of the defined types were placed in this encounter.   -------------------------------

## 2020-09-16 ENCOUNTER — Other Ambulatory Visit: Payer: Self-pay | Admitting: Psychiatry

## 2020-09-16 DIAGNOSIS — F418 Other specified anxiety disorders: Secondary | ICD-10-CM

## 2020-09-16 DIAGNOSIS — F4010 Social phobia, unspecified: Secondary | ICD-10-CM

## 2020-12-02 ENCOUNTER — Other Ambulatory Visit: Payer: Self-pay | Admitting: Psychiatry

## 2020-12-06 ENCOUNTER — Other Ambulatory Visit: Payer: Self-pay | Admitting: Psychiatry

## 2020-12-06 DIAGNOSIS — F418 Other specified anxiety disorders: Secondary | ICD-10-CM

## 2020-12-06 DIAGNOSIS — F4010 Social phobia, unspecified: Secondary | ICD-10-CM

## 2020-12-07 NOTE — Telephone Encounter (Signed)
Call to RS

## 2020-12-14 NOTE — Telephone Encounter (Signed)
Pt has an appt on 8/24

## 2021-01-29 ENCOUNTER — Ambulatory Visit: Payer: BC Managed Care – PPO | Admitting: Psychiatry

## 2021-02-12 ENCOUNTER — Other Ambulatory Visit: Payer: Self-pay | Admitting: Psychiatry

## 2021-02-12 DIAGNOSIS — F4010 Social phobia, unspecified: Secondary | ICD-10-CM

## 2021-02-12 DIAGNOSIS — F418 Other specified anxiety disorders: Secondary | ICD-10-CM

## 2021-03-06 ENCOUNTER — Encounter: Payer: Self-pay | Admitting: Psychiatry

## 2021-03-06 ENCOUNTER — Other Ambulatory Visit: Payer: Self-pay

## 2021-03-06 ENCOUNTER — Ambulatory Visit: Payer: BC Managed Care – PPO | Admitting: Psychiatry

## 2021-03-06 DIAGNOSIS — R61 Generalized hyperhidrosis: Secondary | ICD-10-CM

## 2021-03-06 DIAGNOSIS — F4010 Social phobia, unspecified: Secondary | ICD-10-CM

## 2021-03-06 DIAGNOSIS — F418 Other specified anxiety disorders: Secondary | ICD-10-CM

## 2021-03-06 DIAGNOSIS — F5221 Male erectile disorder: Secondary | ICD-10-CM | POA: Diagnosis not present

## 2021-03-06 MED ORDER — BUPROPION HCL ER (SR) 100 MG PO TB12: 100.0000 mg | ORAL_TABLET | Freq: Every day | ORAL | 0 refills | Status: DC

## 2021-03-06 MED ORDER — PAROXETINE HCL 10 MG PO TABS: 10.0000 mg | ORAL_TABLET | Freq: Every day | ORAL | 0 refills | Status: DC

## 2021-03-06 NOTE — Progress Notes (Signed)
Derek [name] [id number] May 28, 1966 55 y.o.  Subjective:   Patient ID:  Derek [name] is a 55 y.o. (DOB October 19, 1965) male.  Chief Complaint:  Chief Complaint  Patient presents with   Follow-up   Anxiety   Medication Reaction    HPI Derek [name] presents to the office today for follow-up of anxiety, especially social anxiety disorder.    at visit February 16, 2019.  Because of sexual side effects with paroxetine we switched him to Viibryd.  Also initiated a trial of trial Qbrexxza for excessive sweating.   At visit September 17 after switch from paroxetine to Viibryd 20 mg he noticed a reduction in sexual side effects but an increase in anxiety.  The plan was to increase the Viibryd to 40 mg to get maximum benefit for anxiety.Also trial Qbrexxza for excessive sweating.    seen May 11, 2019 on Viibryd 40 mg daily.  He wanted to continue that dose for 6 more weeks to see if he get additional improvement in anxiety.  He also continued on lithium 150 mg daily and propranolol 20 mg twice daily as needed anxiety   seen December 2020. Doing well except ED and slidenifil Rx.   seen September 05, 2019.  The following was noted: Good and very pleased with Viagara.  Insurance is no longer covering Viibryd.  Option Trintellix was given.  Got brain zaps with one missed dose of Viibryd and it was frightening. Anxiety is fairly well controlled except situationally.  Occ use of propranolol.   Other SSRI either revved or blunted energy and feels balanced now.  Not going to gym Dt Covid.  Tried propranolol once with benefit. DT costs must change.  Option fluoxetine or return to paroxetine +/- buspirone.   He's ambialent about which SSRI.   Return to paroxetine alone 20 mg per his choice.   November 02, 2019, appointment, the following is noted: It feels good and familiar. Sleep is good.  Feels a little more sedate than in the past.  Doing transcendental meditation will nod off more than before.  A little  better for rumination and worry.  Jaw and dry mouth is a little worse.  Sex SE anorgasmia.  Viagra helps erection. [name] detox.  Feels a lot more centered and more clear with the diet.  No alcohol nor sugar.  Anxiety is managed.  No panic since here.     When first came in had come off anxiety and had panic and froze in public speaking for 30 min.  Wonders if has PTSD from it and fear of recurrence. Better sleep.  Training in trancendental meditation to help.  02/01/20 appt with the following noted:   Still pleased with paroxetine and using propranolol. Overall good response.    Sleeping fine.  Not depressed.   SE manageable. Plan no changes  08/01/2020 appt noted: Good overall and anxiety managed.  Dry mouth noticeable and also constipating.. Paroxetine working well.  Much less anxiety and sweating when meets clients and some anxiety.  Tried 20mg  and had SE dryness and sexual SE.  Less fixated on public speaking and getting anxious over that.  Has used propranolol for public speaking anxiety for mg daily with some benefit and when flying and this has helped..  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  He is planning planted aquarium Plan; Continue propranolol. Reduce paroxetine to 15 mg daily bc dryness and anxiety managed for months.   03/06/2021 appointment with the following noted: Done  fine with lower dose paroxxetine and some reduction in dryness.   And less sexual SE. 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   OCC BM urgency causes anxiety. Patient reports stable mood and denies depressed or irritable moods.  Patient denies any recent difficulty with anxiety managed well with both meds.  Patient denies difficulty with sleep initiation or maintenance. Denies appetite disturbance.  Patient reports that energy and motivation have been good.  Patient denies any difficulty with concentration.  Patient denies any suicidal ideation. Lost taste and smell from Covid and ENT says  chronic sinusitis and on antibiotic.   SE gained 10-15# with paroxetine and ? Still sluggishness and forgetfulness in afternoon. Tends to stay up to late but really needs 8 hours. Some word-finding issues.  No caffeiene.     Prior psych meds:  Zoloft 50 cog SE and felt physically revved even after a year and initital insomnia,  Lexapro 20, Viibryd 40 mg, fluoxetine paroxetine helped but sexual SE and dryness,  NAC  Review of Systems:  Review of Systems  HENT:         Dryness  Gastrointestinal:  Positive for diarrhea.  Musculoskeletal:  Positive for arthralgias.  Neurological:  Negative for tremors and weakness.   Medications: I have reviewed the patient's current medications.  Current Outpatient Medications  Medication Sig Dispense Refill   Acetylcysteine 600 MG CAPS Take 600 mg by mouth daily.     Ascorbic Acid (VITAMIN C) 1000 MG tablet Take 1,000 mg by mouth daily.     buPROPion ER (WELLBUTRIN SR) 100 MG 12 hr tablet Take 1 tablet (100 mg total) by mouth daily. 90 tablet 0   lithium carbonate 150 MG capsule TAKE ONE CAPSULE BY MOUTH DAILY 90 capsule 3   propranolol (INDERAL) 20 MG tablet TAKE ONE TO TWO TABLETS BY MOUTH TWICE A DAY AS NEEDED FOR ANXIETY 100 tablet 1   sildenafil (VIAGRA) 100 MG tablet Take 1 tablet (100 mg total) by mouth daily as needed for erectile dysfunction. 30 tablet 3   PARoxetine (PAXIL) 10 MG tablet Take 1 tablet (10 mg total) by mouth daily. 90 tablet 0   Zinc Sulfate (ZINC 15 PO) Take by mouth. (Patient not taking: Reported on 03/06/2021)     No current facility-administered medications for this visit.    Medication Side Effects: Other: Sexual side effects manageable  Allergies: No Known Allergies  Past Medical History:  Diagnosis Date   Anxiety     Family History  Problem Relation Age of Onset   Anxiety disorder Father    Depression Father     Social History   Socioeconomic History   Marital status: Married    Spouse name: Not on  file   Number of children: Not on file   Years of education: Not on file   Highest education level: Not on file  Occupational History   Not on file  Tobacco Use   Smoking status: Never   Smokeless tobacco: Former  Substance and Sexual Activity   Alcohol use: Yes    Comment: weekly-social   Drug use: Never   Sexual activity: Yes    Partners: Female    Birth control/protection: None  Other Topics Concern   Not on file  Social History Narrative   Not on file   Social Determinants of Health   Financial Resource Strain: Not on file  Food Insecurity: Not on file  Transportation Needs: Not on file  Physical Activity: Not on file  Stress: Not  on file  Social Connections: Not on file  Intimate Partner Violence: Not on file    Past Medical History, Surgical history, Social history, and Family history were reviewed and updated as appropriate.   Please see review of systems for further details on the patient's review from today.   Objective:   Physical Exam:  There were no vitals taken for this visit.  Physical Exam Constitutional:      General: He is not in acute distress. Musculoskeletal:        General: No deformity.  Neurological:     Mental Status: He is alert and oriented to person, place, and time.     Coordination: Coordination normal.  Psychiatric:        Attention and Perception: Attention and perception normal. He does not perceive auditory or visual hallucinations.        Mood and Affect: Mood is anxious. Mood is not depressed. Affect is not labile, blunt, angry or inappropriate.        Speech: Speech normal.        Behavior: Behavior normal.        Thought Content: Thought content normal. Thought content is not paranoid or delusional. Thought content does not include homicidal or suicidal ideation. Thought content does not include homicidal or suicidal plan.        Cognition and Memory: Cognition and memory normal.        Judgment: Judgment normal.      Comments: Insight intact Anxiety manageable.    Lab Review:  No results found for: NA, K, CL, CO2, GLUCOSE, BUN, CREATININE, CALCIUM, PROT, ALBUMIN, AST, ALT, ALKPHOS, BILITOT, GFRNONAA, GFRAA  No results found for: WBC, RBC, HGB, HCT, PLT, MCV, MCH, MCHC, RDW, LYMPHSABS, MONOABS, EOSABS, BASOSABS  No results found for: POCLITH, LITHIUM   No results found for: PHENYTOIN, PHENOBARB, VALPROATE, CBMZ   .res Assessment: Plan:    Derek [name] was seen today for follow-up, anxiety and medication reaction.  Diagnoses and all orders for this visit:  Social anxiety disorder -     PARoxetine (PAXIL) 10 MG tablet; Take 1 tablet (10 mg total) by mouth daily.  Excessive sweating  Performance anxiety -     PARoxetine (PAXIL) 10 MG tablet; Take 1 tablet (10 mg total) by mouth daily.  Erectile disorder, acquired, generalized, moderate  Other orders -     buPROPion ER (WELLBUTRIN SR) 100 MG 12 hr tablet; Take 1 tablet (100 mg total) by mouth daily.   No change indicated with good response.  Overall better response with paroxetine then other meds tried as noted above.  Still has some sexual side effects but they are manageable.  High relapse risk of stops medication.  He is agreeable to continuing the meds without change. Continue propranolol.  Continue paroxetine to 15 mg vs reduce to 10 mg to minimize SE daily bc dryness and anxiety managed for months.  Disc management of diarrhea from anxiety.  NAC is not as helpful as he'd like cognitively.  Wants a bit more help. Wellbutrin SR 100 mg AM for energy, cognition, appetite suppressant. Wait 2-3 week and reduce paroxetine to 10 mg nightly  FU 3 mos  Meredith Staggers, MD, DFAPA   Please see After Visit Summary for patient specific instructions.  No future appointments.  No orders of the defined types were placed in this encounter.   -------------------------------

## 2021-03-06 NOTE — Patient Instructions (Signed)
Wellbutrin SR 100 mg AM for energy, cognition, appetite suppressant. Wait 2-3 week and reduce paroxetine to 10 mg nightly

## 2021-05-17 ENCOUNTER — Other Ambulatory Visit: Payer: Self-pay | Admitting: Internal Medicine

## 2021-05-17 DIAGNOSIS — E782 Mixed hyperlipidemia: Secondary | ICD-10-CM

## 2021-05-28 ENCOUNTER — Other Ambulatory Visit: Payer: Self-pay | Admitting: Psychiatry

## 2021-05-28 DIAGNOSIS — F418 Other specified anxiety disorders: Secondary | ICD-10-CM

## 2021-05-28 DIAGNOSIS — F4010 Social phobia, unspecified: Secondary | ICD-10-CM

## 2021-05-30 ENCOUNTER — Other Ambulatory Visit: Payer: Self-pay | Admitting: Psychiatry

## 2021-06-05 ENCOUNTER — Encounter: Payer: Self-pay | Admitting: Psychiatry

## 2021-06-05 ENCOUNTER — Ambulatory Visit: Payer: BC Managed Care – PPO | Admitting: Psychiatry

## 2021-06-05 ENCOUNTER — Other Ambulatory Visit: Payer: Self-pay

## 2021-06-05 DIAGNOSIS — F418 Other specified anxiety disorders: Secondary | ICD-10-CM

## 2021-06-05 DIAGNOSIS — F4010 Social phobia, unspecified: Secondary | ICD-10-CM | POA: Diagnosis not present

## 2021-06-05 DIAGNOSIS — R61 Generalized hyperhidrosis: Secondary | ICD-10-CM | POA: Diagnosis not present

## 2021-06-05 DIAGNOSIS — F5221 Male erectile disorder: Secondary | ICD-10-CM

## 2021-06-05 MED ORDER — PROPRANOLOL HCL 20 MG PO TABS: ORAL_TABLET | ORAL | 1 refills | Status: DC

## 2021-06-05 MED ORDER — PAROXETINE HCL 10 MG PO TABS: 10.0000 mg | ORAL_TABLET | Freq: Every day | ORAL | 1 refills | Status: DC

## 2021-06-05 MED ORDER — BUPROPION HCL ER (SR) 100 MG PO TB12: 100.0000 mg | ORAL_TABLET | Freq: Every day | ORAL | 1 refills | Status: DC

## 2021-06-05 NOTE — Progress Notes (Signed)
Derek [name] [id number] September 29, 1965 55 y.o.  Subjective:   Patient ID:  Derek [name] is a 56 y.o. (DOB 09-25-1965) male.  Chief Complaint:  Chief Complaint  Patient presents with   Follow-up   Social anxiety disorder    HPI [name] presents to the office today for follow-up of anxiety, especially social anxiety disorder.    at visit February 16, 2019.  Because of sexual side effects with paroxetine we switched him to Viibryd.  Also initiated a trial of trial Qbrexxza for excessive sweating.   At visit September 17 after switch from paroxetine to Viibryd 20 mg he noticed a reduction in sexual side effects but an increase in anxiety.  The plan was to increase the Viibryd to 40 mg to get maximum benefit for anxiety.Also trial Qbrexxza for excessive sweating.    seen May 11, 2019 on Viibryd 40 mg daily.  He wanted to continue that dose for 6 more weeks to see if he get additional improvement in anxiety.  He also continued on lithium 150 mg daily and propranolol 20 mg twice daily as needed anxiety   seen December 2020. Doing well except ED and slidenifil Rx.   seen September 05, 2019.  The following was noted: Good and very pleased with Viagara.  Insurance is no longer covering Viibryd.  Option Trintellix was given.  Got brain zaps with one missed dose of Viibryd and it was frightening. Anxiety is fairly well controlled except situationally.  Occ use of propranolol.   Other SSRI either revved or blunted energy and feels balanced now.  Not going to gym Dt Covid.  Tried propranolol once with benefit. DT costs must change.  Option fluoxetine or return to paroxetine +/- buspirone.   He's ambialent about which SSRI.   Return to paroxetine alone 20 mg per his choice.   November 02, 2019, appointment, the following is noted: It feels good and familiar. Sleep is good.  Feels a little more sedate than in the past.  Doing transcendental meditation will nod off more than before.  A little better  for rumination and worry.  Jaw and dry mouth is a little worse.  Sex SE anorgasmia.  Viagra helps erection. [name] detox.  Feels a lot more centered and more clear with the diet.  No alcohol nor sugar.  Anxiety is managed.  No panic since here.     When first came in had come off anxiety and had panic and froze in public speaking for 30 min.  Wonders if has PTSD from it and fear of recurrence. Better sleep.  Training in trancendental meditation to help.  02/01/20 appt with the following noted:   Still pleased with paroxetine and using propranolol. Overall good response.    Sleeping fine.  Not depressed.   SE manageable. Plan no changes  08/01/2020 appt noted: Good overall and anxiety managed.  Dry mouth noticeable and also constipating.. Paroxetine working well.  Much less anxiety and sweating when meets clients and some anxiety.  Tried 20mg  and had SE dryness and sexual SE.  Less fixated on public speaking and getting anxious over that.  Has used propranolol for public speaking anxiety for mg daily with some benefit and when flying and this has helped..  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  He is planning planted aquarium Plan; Continue propranolol. Reduce paroxetine to 15 mg daily bc dryness and anxiety managed for months.   03/06/2021 appointment with the following noted: Done fine with  lower dose paroxxetine and some reduction in dryness.   And less sexual SE. 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   OCC BM urgency causes anxiety. Patient reports stable mood and denies depressed or irritable moods.  Patient denies any recent difficulty with anxiety managed well with both meds.  Patient denies difficulty with sleep initiation or maintenance. Denies appetite disturbance.  Patient reports that energy and motivation have been good.  Patient denies any difficulty with concentration.  Patient denies any suicidal ideation. 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   SE gained 10-15# with paroxetine and ? Still sluggishness and forgetfulness in afternoon. Tends to stay up to late but really needs 8 hours. Some word-finding issues.  No caffeiene.    Plan: Continue propranolol. Continue paroxetine to 15 mg vs reduce to 10 mg to minimize SE daily bc dryness and anxiety managed for months. Disc management of diarrhea from anxiety. NAC is not as helpful as he'd like cognitively.  Wants a bit more help. Wellbutrin SR 100 mg AM for energy, cognition, appetite suppressant. Wait 2-3 week and reduce paroxetine to 10 mg nightly  06/05/2021 appointment with the following noted: Best combination I've ever had with better energy on Wellbutrin added.  More like myself. Still some phobia but not problematic avoidance.   Found out have DM 2 and on edge about it..  Not fully treated yet.  Starting metformin. Questions of paroxetine and DM 2.   Still unrecovered taste and smell from Covid. Lost 8 # with dietary changes and A1C is better.   SE dryness increased with betablocers. Phobia of air travel.  Prior psych meds:  Zoloft 50 cog SE and felt physically revved even after a year and initital insomnia,  Lexapro 20, Viibryd 40 mg, fluoxetine paroxetine helped but sexual SE and dryness,  NAC  Review of Systems:  Review of Systems  HENT:         Dryness  Gastrointestinal:  Positive for diarrhea.  Musculoskeletal:  Positive for arthralgias.  Neurological:  Negative for tremors and weakness.   Medications: I have reviewed the patient's current medications.  Current Outpatient Medications  Medication Sig Dispense Refill   Acetylcysteine 600 MG CAPS Take 600 mg by mouth daily.     Ascorbic Acid (VITAMIN C) 1000 MG tablet Take 1,000 mg by mouth daily.     lithium carbonate 150 MG capsule TAKE ONE CAPSULE BY MOUTH DAILY 90 capsule 3   metFORMIN (GLUCOPHAGE-XR) 750 MG 24 hr tablet Take 750 mg by mouth daily. Not taking yet      sildenafil (VIAGRA) 100 MG tablet Take 1 tablet (100 mg total) by mouth daily as needed for erectile dysfunction. 30 tablet 3   buPROPion ER (WELLBUTRIN SR) 100 MG 12 hr tablet Take 1 tablet (100 mg total) by mouth daily. 90 tablet 1   PARoxetine (PAXIL) 10 MG tablet Take 1 tablet (10 mg total) by mouth daily. 90 tablet 1   propranolol (INDERAL) 20 MG tablet TAKE ONE TO TWO TABLETS BY MOUTH TWICE A DAY AS NEEDED FOR ANXIETY 100 tablet 1   Zinc Sulfate (ZINC 15 PO) Take by mouth.     No current facility-administered medications for this visit.    Medication Side Effects: Other: Sexual side effects manageable  Allergies: No Known Allergies  Past Medical History:  Diagnosis Date   Anxiety     Family History  Problem Relation Age of Onset   Anxiety disorder Father  Depression Father     Social History   Socioeconomic History   Marital status: Married    Spouse name: Not on file   Number of children: Not on file   Years of education: Not on file   Highest education level: Not on file  Occupational History   Not on file  Tobacco Use   Smoking status: Never   Smokeless tobacco: Former  Substance and Sexual Activity   Alcohol use: Yes    Comment: weekly-social   Drug use: Never   Sexual activity: Yes    Partners: Female    Birth control/protection: None  Other Topics Concern   Not on file  Social History Narrative   Not on file   Social Determinants of Health   Financial Resource Strain: Not on file  Food Insecurity: Not on file  Transportation Needs: Not on file  Physical Activity: Not on file  Stress: Not on file  Social Connections: Not on file  Intimate Partner Violence: Not on file    Past Medical History, Surgical history, Social history, and Family history were reviewed and updated as appropriate.   Please see review of systems for further details on the patient's review from today.   Objective:   Physical Exam:  There were no vitals taken for this  visit.  Physical Exam Constitutional:      General: He is not in acute distress. Musculoskeletal:        General: No deformity.  Neurological:     Mental Status: He is alert and oriented to person, place, and time.     Coordination: Coordination normal.  Psychiatric:        Attention and Perception: Attention and perception normal. He does not perceive auditory or visual hallucinations.        Mood and Affect: Mood is anxious. Mood is not depressed. Affect is not labile, blunt, angry or inappropriate.        Speech: Speech normal.        Behavior: Behavior normal.        Thought Content: Thought content normal. Thought content is not paranoid or delusional. Thought content does not include homicidal or suicidal ideation. Thought content does not include homicidal or suicidal plan.        Cognition and Memory: Cognition and memory normal.        Judgment: Judgment normal.     Comments: Insight intact Anxiety manageable.    Lab Review:  No results found for: NA, K, CL, CO2, GLUCOSE, BUN, CREATININE, CALCIUM, PROT, ALBUMIN, AST, ALT, ALKPHOS, BILITOT, GFRNONAA, GFRAA  No results found for: WBC, RBC, HGB, HCT, PLT, MCV, MCH, MCHC, RDW, LYMPHSABS, MONOABS, EOSABS, BASOSABS  No results found for: POCLITH, LITHIUM   No results found for: PHENYTOIN, PHENOBARB, VALPROATE, CBMZ   .res Assessment: Plan:    Gradin was seen today for follow-up and social anxiety disorder.  Diagnoses and all orders for this visit:  Social anxiety disorder -     PARoxetine (PAXIL) 10 MG tablet; Take 1 tablet (10 mg total) by mouth daily. -     propranolol (INDERAL) 20 MG tablet; TAKE ONE TO TWO TABLETS BY MOUTH TWICE A DAY AS NEEDED FOR ANXIETY  Excessive sweating  Performance anxiety -     PARoxetine (PAXIL) 10 MG tablet; Take 1 tablet (10 mg total) by mouth daily. -     propranolol (INDERAL) 20 MG tablet; TAKE ONE TO TWO TABLETS BY MOUTH TWICE A DAY AS NEEDED FOR ANXIETY  Erectile disorder,  acquired, generalized, moderate  Other orders -     buPROPion ER (WELLBUTRIN SR) 100 MG 12 hr tablet; Take 1 tablet (100 mg total) by mouth daily.   No change indicated with good response.  Overall better response with paroxetine then other meds tried as noted above.  Still has some sexual side effects but they are manageable.  High relapse risk of stops medication.  He is agreeable to continuing the meds without change. Continue propranolol.  Disc management of diarrhea from anxiety.  NAC is not as helpful as he'd like cognitively.  Wants a bit more help. Wellbutrin SR 100 mg AM for energy, cognition, appetite suppressant helped Continue paroxetine to 10 mg nightly  Option Ozempic or Monjourno for wt loss and DM discussed. Encourage weight loss and exercise.  FU 4 mos  Meredith Staggers, MD, DFAPA   Please see After Visit Summary for patient specific instructions.  Future Appointments  Date Time Provider Department Center  06/21/2021 10:40 AM [facility] 1 [facility]    No orders of the defined types were placed in this encounter.   -------------------------------

## 2021-06-11 ENCOUNTER — Other Ambulatory Visit: Payer: Self-pay

## 2021-06-21 ENCOUNTER — Ambulatory Visit
Admission: RE | Admit: 2021-06-21 | Discharge: 2021-06-21 | Disposition: A | Discharge status: Home / Self Care | Payer: No Typology Code available for payment source | Source: Ambulatory Visit | Attending: Internal Medicine | Admitting: Internal Medicine

## 2021-06-21 DIAGNOSIS — E782 Mixed hyperlipidemia: Secondary | ICD-10-CM

## 2021-08-30 ENCOUNTER — Other Ambulatory Visit: Payer: Self-pay | Admitting: Psychiatry

## 2021-10-08 ENCOUNTER — Ambulatory Visit: Payer: BC Managed Care – PPO | Admitting: Psychiatry

## 2021-10-08 ENCOUNTER — Other Ambulatory Visit: Payer: Self-pay

## 2021-10-08 ENCOUNTER — Encounter: Payer: Self-pay | Admitting: Psychiatry

## 2021-10-08 DIAGNOSIS — R61 Generalized hyperhidrosis: Secondary | ICD-10-CM

## 2021-10-08 DIAGNOSIS — F418 Other specified anxiety disorders: Secondary | ICD-10-CM

## 2021-10-08 DIAGNOSIS — F4010 Social phobia, unspecified: Secondary | ICD-10-CM

## 2021-10-08 DIAGNOSIS — F5221 Male erectile disorder: Secondary | ICD-10-CM | POA: Diagnosis not present

## 2021-10-08 MED ORDER — SILDENAFIL CITRATE 100 MG PO TABS: 100.0000 mg | ORAL_TABLET | Freq: Every day | ORAL | 3 refills | Status: DC | PRN

## 2021-10-08 MED ORDER — LITHIUM CARBONATE 150 MG PO CAPS: 150.0000 mg | ORAL_CAPSULE | Freq: Every day | ORAL | 3 refills | Status: DC

## 2021-10-08 MED ORDER — PROPRANOLOL HCL 20 MG PO TABS: ORAL_TABLET | ORAL | 1 refills | Status: DC

## 2021-10-08 MED ORDER — BUPROPION HCL ER (SR) 100 MG PO TB12: 100.0000 mg | ORAL_TABLET | Freq: Every day | ORAL | 1 refills | Status: DC

## 2021-10-08 MED ORDER — PAROXETINE HCL 10 MG PO TABS: 10.0000 mg | ORAL_TABLET | Freq: Every day | ORAL | 1 refills | Status: DC

## 2021-10-08 NOTE — Progress Notes (Signed)
[name] ?[id number] ?1965-12-12 ?56 y.o. ? ?Subjective:  ? ?Patient ID:  Derek [name] is a 56 y.o. (DOB 02/08/1966) male. ? ?Chief Complaint:  ?No chief complaint on file. ? ? ?HPI ?[name] presents to the office today for follow-up of anxiety, especially social anxiety disorder.  ?  ?at visit February 16, 2019.  Because of sexual side effects with paroxetine we switched him to Viibryd.  Also initiated a trial of trial Qbrexxza for excessive sweating. ?  ?At visit September 17 after switch from paroxetine to Viibryd 20 mg he noticed a reduction in sexual side effects but an increase in anxiety.  The plan was to increase the Viibryd to 40 mg to get maximum benefit for anxiety.Also trial Qbrexxza for excessive sweating.  ?  ?seen May 11, 2019 on Viibryd 40 mg daily.  He wanted to continue that dose for 6 more weeks to see if he get additional improvement in anxiety.  He also continued on lithium 150 mg daily and propranolol 20 mg twice daily as needed anxiety ?  ?seen December 2020. Doing well except ED and slidenifil Rx. ?  ?seen September 05, 2019.  The following was noted: ?Good and very pleased with Viagara.  Insurance is no longer covering Viibryd.  Option Trintellix was given.  Got brain zaps with one missed dose of Viibryd and it was frightening. ?Anxiety is fairly well controlled except situationally.  Occ use of propranolol.   Other SSRI either revved or blunted energy and feels balanced now.  Not going to gym Dt Covid.  ?Tried propranolol once with benefit. ?DT costs must change.  Option fluoxetine or return to paroxetine +/- buspirone.   He's ambialent about which SSRI.   Return to paroxetine alone 20 mg per his choice. ?  ?November 02, 2019, appointment, the following is noted: ?It feels good and familiar. Sleep is good.  Feels a little more sedate than in the past.  Doing transcendental meditation will nod off more than before.  A little better for rumination and worry.  Jaw and dry mouth is a  little worse.  Sex SE anorgasmia.  Viagra helps erection. ?[name] detox.  Feels a lot more centered and more clear with the diet.  No alcohol nor sugar.  ?Anxiety is managed.  No panic since here.   ?  ?When first came in had come off anxiety and had panic and froze in public speaking for 30 min.  Wonders if has PTSD from it and fear of recurrence. Better sleep.  Training in trancendental meditation to help. ? ?02/01/20 appt with the following noted:   ?Still pleased with paroxetine and using propranolol. ?Overall good response.    ?Sleeping fine.  Not depressed.   ?SE manageable. ?Plan no changes ? ?08/01/2020 appt noted: ?Good overall and anxiety managed.  Dry mouth noticeable and also constipating.. Paroxetine working well.  Much less anxiety and sweating when meets clients and some anxiety.  Tried 20mg  and had SE dryness and sexual SE.  Less fixated on public speaking and getting anxious over that.  Has used propranolol for public speaking anxiety for mg daily with some benefit and when flying and this has helped..  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 ? He is planning planted aquarium ?Plan; Continue propranolol. ?Reduce paroxetine to 15 mg daily bc dryness and anxiety managed for months.  ? ?03/06/2021 appointment with the following noted: ?Done fine with lower dose paroxxetine and some reduction in dryness.  And less sexual SE. 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   ?OCC BM urgency causes anxiety. ?Patient reports stable mood and denies depressed or irritable moods.  Patient denies any recent difficulty with anxiety managed well with both meds.  Patient denies difficulty with sleep initiation or maintenance. Denies appetite disturbance.  Patient reports that energy and motivation have been good.  Patient denies any difficulty with concentration.  Patient denies any suicidal ideation. ?Lost taste and smell from Covid and ENT says chronic sinusitis and on antibiotic.   ?SE gained 10-15#  with paroxetine and ? Still sluggishness and forgetfulness in afternoon. ?Tends to stay up to late but really needs 8 hours. ?Some word-finding issues.  ?No caffeiene.    ?Plan: Continue propranolol. ?Continue paroxetine to 15 mg vs reduce to 10 mg to minimize SE daily bc dryness and anxiety managed for months. ?Disc management of diarrhea from anxiety. ?NAC is not as helpful as he'd like cognitively.  Wants a bit more help. ?Wellbutrin SR 100 mg AM for energy, cognition, appetite suppressant. ?Wait 2-3 week and reduce paroxetine to 10 mg nightly ? ?06/05/2021 appointment with the following noted: ?Best combination I've ever had with better energy on Wellbutrin added.  More like myself. ?Still some phobia but not problematic avoidance.   ?Found out have DM 2 and on edge about it..  Not fully treated yet.  Starting metformin. ?Questions of paroxetine and DM 2.   ?Still unrecovered taste and smell from Covid. ?Lost 8 # with dietary changes and A1C is better.   ?SE dryness increased with betablocers. ?Phobia of air travel. ?Plan: NAC is not as helpful as he'd like cognitively.  Wants a bit more help. ?Wellbutrin SR 100 mg AM for energy, cognition, appetite suppressant helped ?Continue paroxetine to 10 mg nightly ?Continue propranolol as needed anxiety ? ?10/08/2021 appointment with the following noted: ?Great combination.  Better energy with Wellbutrin.  No med complaints.  Sx under good control. ?SE a little slow.  A little longer to do things.  Calmer with meds.  Not impaired.   ?Sleep not enough DT schedule.  Stays up too late. ?Reduced drinking by 90% and pleased by that. ?No caffeine. ? ?Prior psych meds:  Zoloft 50 cog SE and felt physically revved even after a year and initital insomnia,  ?Lexapro 20, Viibryd 40 mg, fluoxetine ?paroxetine helped but sexual SE and dryness,  ?Wellbutr SR 100 ?NAC ? ?Review of Systems:  ?Review of Systems  ?Constitutional:  Positive for fatigue.  ?HENT:    ?     Dryness   ?Gastrointestinal:  Positive for diarrhea.  ?Musculoskeletal:  Positive for arthralgias.  ?Neurological:  Negative for tremors and weakness.  ? ?Medications: I have reviewed the patient's current medications. ? ?Current Outpatient Medications  ?Medication Sig Dispense Refill  ? Acetylcysteine 600 MG CAPS Take 600 mg by mouth daily.    ? Ascorbic Acid (VITAMIN C) 1000 MG tablet Take 1,000 mg by mouth daily.    ? metFORMIN (GLUCOPHAGE-XR) 750 MG 24 hr tablet Take 750 mg by mouth daily. Not taking yet    ? Zinc Sulfate (ZINC 15 PO) Take by mouth.    ? buPROPion ER (WELLBUTRIN SR) 100 MG 12 hr tablet Take 1 tablet (100 mg total) by mouth daily. 90 tablet 1  ? lithium carbonate 150 MG capsule Take 1 capsule (150 mg total) by mouth daily. 90 capsule 3  ? PARoxetine (PAXIL) 10 MG tablet Take 1 tablet (10 mg total) by mouth daily.  90 tablet 1  ? propranolol (INDERAL) 20 MG tablet TAKE ONE TO TWO TABLETS BY MOUTH TWICE A DAY AS NEEDED FOR ANXIETY 100 tablet 1  ? sildenafil (VIAGRA) 100 MG tablet Take 1 tablet (100 mg total) by mouth daily as needed for erectile dysfunction. 30 tablet 3  ? ?No current facility-administered medications for this visit.  ? ? ?Medication Side Effects: Other: Sexual side effects manageable ? ?Allergies: No Known Allergies ? ?Past Medical History:  ?Diagnosis Date  ? Anxiety   ? ? ?Family History  ?Problem Relation Age of Onset  ? Anxiety disorder Father   ? Depression Father   ? ? ?Social History  ? ?Socioeconomic History  ? Marital status: Married  ?  Spouse name: Not on file  ? Number of children: Not on file  ? Years of education: Not on file  ? Highest education level: Not on file  ?Occupational History  ? Not on file  ?Tobacco Use  ? Smoking status: Never  ? Smokeless tobacco: Former  ?Substance and Sexual Activity  ? Alcohol use: Yes  ?  Comment: weekly-social  ? Drug use: Never  ? Sexual activity: Yes  ?  Partners: Female  ?  Birth control/protection: None  ?Other Topics Concern  ? Not on  file  ?Social History Narrative  ? Not on file  ? ?Social Determinants of Health  ? ?Financial Resource Strain: Not on file  ?Food Insecurity: Not on file  ?Transportation Needs: Not on file  ?Physical Activity: Not o

## 2021-11-24 ENCOUNTER — Other Ambulatory Visit: Payer: Self-pay | Admitting: Psychiatry

## 2021-11-24 DIAGNOSIS — F4010 Social phobia, unspecified: Secondary | ICD-10-CM

## 2022-04-10 ENCOUNTER — Ambulatory Visit: Payer: BC Managed Care – PPO | Admitting: Psychiatry

## 2022-04-10 ENCOUNTER — Encounter: Payer: Self-pay | Admitting: Psychiatry

## 2022-04-10 DIAGNOSIS — F418 Other specified anxiety disorders: Secondary | ICD-10-CM | POA: Diagnosis not present

## 2022-04-10 DIAGNOSIS — R61 Generalized hyperhidrosis: Secondary | ICD-10-CM

## 2022-04-10 DIAGNOSIS — F4010 Social phobia, unspecified: Secondary | ICD-10-CM | POA: Diagnosis not present

## 2022-04-10 MED ORDER — BUPROPION HCL ER (SR) 100 MG PO TB12: 100.0000 mg | ORAL_TABLET | Freq: Every day | ORAL | 3 refills | Status: DC

## 2022-04-10 MED ORDER — PROPRANOLOL HCL 20 MG PO TABS: ORAL_TABLET | ORAL | 1 refills | Status: DC

## 2022-04-10 MED ORDER — PAROXETINE HCL 10 MG PO TABS: 10.0000 mg | ORAL_TABLET | Freq: Every day | ORAL | 3 refills | Status: DC

## 2022-04-10 NOTE — Progress Notes (Signed)
Derek [name] [id number] [birth year] 56 y.o.  Subjective:   Patient ID:  Derek [name] is a 56 y.o. (DOB 10/13/1965) male.  Chief Complaint:  Chief Complaint  Patient presents with   Follow-up   Anxiety     HPI Derek [name] presents to the office today for follow-up of anxiety, especially social anxiety disorder.    at visit February 16, 2019.  Because of sexual side effects with paroxetine we switched him to Viibryd.  Also initiated a trial of trial Qbrexxza for excessive sweating.   At visit September 17 after switch from paroxetine to Viibryd 20 mg he noticed a reduction in sexual side effects but an increase in anxiety.  The plan was to increase the Viibryd to 40 mg to get maximum benefit for anxiety.Also trial Qbrexxza for excessive sweating.    seen May 11, 2019 on Viibryd 40 mg daily.  He wanted to continue that dose for 6 more weeks to see if he get additional improvement in anxiety.  He also continued on lithium 150 mg daily and propranolol 20 mg twice daily as needed anxiety   seen December 2020. Doing well except ED and slidenifil Rx.   seen September 05, 2019.  The following was noted: Good and very pleased with Viagara.  Insurance is no longer covering Viibryd.  Option Trintellix was given.  Got brain zaps with one missed dose of Viibryd and it was frightening. Anxiety is fairly well controlled except situationally.  Occ use of propranolol.   Other SSRI either revved or blunted energy and feels balanced now.  Not going to gym Dt Covid.  Tried propranolol once with benefit. DT costs must change.  Option fluoxetine or return to paroxetine +/- buspirone.   He's ambialent about which SSRI.   Return to paroxetine alone 20 mg per his choice.   November 02, 2019, appointment, the following is noted: It feels good and familiar. Sleep is good.  Feels a little more sedate than in the past.  Doing transcendental meditation will nod off more than before.  A little better for rumination  and worry.  Jaw and dry mouth is a little worse.  Sex SE anorgasmia.  Viagra helps erection. [name] detox.  Feels a lot more centered and more clear with the diet.  No alcohol nor sugar.  Anxiety is managed.  No panic since here.     When first came in had come off anxiety and had panic and froze in public speaking for 30 min.  Wonders if has PTSD from it and fear of recurrence. Better sleep.  Training in trancendental meditation to help.  02/01/20 appt with the following noted:   Still pleased with paroxetine and using propranolol. Overall good response.    Sleeping fine.  Not depressed.   SE manageable. Plan no changes  08/01/2020 appt noted: Good overall and anxiety managed.  Dry mouth noticeable and also constipating.. Paroxetine working well.  Much less anxiety and sweating when meets clients and some anxiety.  Tried 20mg  and had SE dryness and sexual SE.  Less fixated on public speaking and getting anxious over that.  Has used propranolol for public speaking anxiety for mg daily with some benefit and when flying and this has helped..  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  He is planning planted aquarium Plan; Continue propranolol. Reduce paroxetine to 15 mg daily bc dryness and anxiety managed for months.   03/06/2021 appointment with the following noted: Done fine with lower  dose paroxxetine and some reduction in dryness.   And less sexual SE. 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   OCC BM urgency causes anxiety. Patient reports stable mood and denies depressed or irritable moods.  Patient denies any recent difficulty with anxiety managed well with both meds.  Patient denies difficulty with sleep initiation or maintenance. Denies appetite disturbance.  Patient reports that energy and motivation have been good.  Patient denies any difficulty with concentration.  Patient denies any suicidal ideation. Lost taste and smell from Covid and ENT says chronic sinusitis and  on antibiotic.   SE gained 10-15# with paroxetine and ? Still sluggishness and forgetfulness in afternoon. Tends to stay up to late but really needs 8 hours. Some word-finding issues.  No caffeiene.    Plan: Continue propranolol. Continue paroxetine to 15 mg vs reduce to 10 mg to minimize SE daily bc dryness and anxiety managed for months. Disc management of diarrhea from anxiety. NAC is not as helpful as he'd like cognitively.  Wants a bit more help. Wellbutrin SR 100 mg AM for energy, cognition, appetite suppressant. Wait 2-3 week and reduce paroxetine to 10 mg nightly  06/05/2021 appointment with the following noted: Best combination I've ever had with better energy on Wellbutrin added.  More like myself. Still some phobia but not problematic avoidance.   Found out have DM 2 and on edge about it..  Not fully treated yet.  Starting metformin. Questions of paroxetine and DM 2.   Still unrecovered taste and smell from Covid. Lost 8 # with dietary changes and A1C is better.   SE dryness increased with betablocers. Phobia of air travel. Plan: NAC is not as helpful as he'd like cognitively.  Wants a bit more help. Wellbutrin SR 100 mg AM for energy, cognition, appetite suppressant helped Continue paroxetine to 10 mg nightly Continue propranolol as needed anxiety  10/08/2021 appointment with the following noted: Great combination.  Better energy with Wellbutrin.  No med complaints.  Sx under good control. SE a little slow.  A little longer to do things.  Calmer with meds.  Not impaired.   Sleep not enough DT schedule.  Stays up too late. Reduced drinking by 90% and pleased by that. No caffeine. Plan: NAC is not as helpful as he'd like cognitively.  Wants a bit more help. Wellbutrin SR 100 mg AM for energy, cognition, appetite suppressant helped Continue paroxetine to 10 mg nightly  04/10/2022 appointment noted: Great.  No med complaints.  Pleased with meds.  Hope it holds out for a  long time. Not a problem with SE Anxiety more manageable than in a decade or so. Not depressed. Sleep is ok. Therapy with Derek [name] is going well.  Good match. Lost 30#. And A!C is better and no brain fog anymore. Not drinking nearly as much.  Prior psych meds:  Zoloft 50 cog SE and felt physically revved even after a year and initital insomnia,  Lexapro 20, Viibryd 40 mg, fluoxetine paroxetine helped but sexual SE and dryness,  Wellbutr SR 100 NAC  Review of Systems:  Review of Systems  Constitutional:  Negative for fatigue.  HENT:         Dryness  Musculoskeletal:  Positive for arthralgias.  Neurological:  Negative for tremors.    Medications: I have reviewed the patient's current medications.  Current Outpatient Medications  Medication Sig Dispense Refill   Acetylcysteine 600 MG CAPS Take 600 mg by mouth daily.     Ascorbic Acid (VITAMIN  C) 1000 MG tablet Take 1,000 mg by mouth daily.     lithium carbonate 150 MG capsule Take 1 capsule (150 mg total) by mouth daily. 90 capsule 3   metFORMIN (GLUCOPHAGE-XR) 750 MG 24 hr tablet Take 750 mg by mouth daily. Not taking yet     sildenafil (VIAGRA) 100 MG tablet Take 1 tablet (100 mg total) by mouth daily as needed for erectile dysfunction. 30 tablet 3   Zinc Sulfate (ZINC 15 PO) Take by mouth.     buPROPion ER (WELLBUTRIN SR) 100 MG 12 hr tablet Take 1 tablet (100 mg total) by mouth daily. 90 tablet 3   PARoxetine (PAXIL) 10 MG tablet Take 1 tablet (10 mg total) by mouth daily. 90 tablet 3   propranolol (INDERAL) 20 MG tablet TAKE ONE TO TWO TABLETS BY MOUTH TWICE A DAY AS NEEDED FOR ANXIETY 100 tablet 1   No current facility-administered medications for this visit.    Medication Side Effects: Other: Sexual side effects manageable  Allergies: No Known Allergies  Past Medical History:  Diagnosis Date   Anxiety     Family History  Problem Relation Age of Onset   Anxiety disorder Father    Depression Father      Social History   Socioeconomic History   Marital status: Married    Spouse name: Not on file   Number of children: Not on file   Years of education: Not on file   Highest education level: Not on file  Occupational History   Not on file  Tobacco Use   Smoking status: Never   Smokeless tobacco: Former  Substance and Sexual Activity   Alcohol use: Yes    Comment: weekly-social   Drug use: Never   Sexual activity: Yes    Partners: Female    Birth control/protection: None  Other Topics Concern   Not on file  Social History Narrative   Not on file   Social Determinants of Health   Financial Resource Strain: Not on file  Food Insecurity: Not on file  Transportation Needs: Not on file  Physical Activity: Not on file  Stress: Not on file  Social Connections: Not on file  Intimate Partner Violence: Not on file    Past Medical History, Surgical history, Social history, and Family history were reviewed and updated as appropriate.   Please see review of systems for further details on the patient's review from today.   Objective:   Physical Exam:  There were no vitals taken for this visit.  Physical Exam Constitutional:      General: He is not in acute distress. Musculoskeletal:        General: No deformity.  Neurological:     Mental Status: He is alert and oriented to person, place, and time.     Coordination: Coordination normal.  Psychiatric:        Attention and Perception: Attention and perception normal. He does not perceive auditory or visual hallucinations.        Mood and Affect: Mood is not anxious or depressed. Affect is not labile, blunt, angry or inappropriate.        Speech: Speech normal.        Behavior: Behavior normal.        Thought Content: Thought content normal. Thought content is not paranoid or delusional. Thought content does not include homicidal or suicidal ideation. Thought content does not include suicidal plan.        Cognition and  Memory:  Cognition and memory normal.        Judgment: Judgment normal.     Comments: Insight intact Anxietywell controlled     Lab Review:  No results found for: "NA", "K", "CL", "CO2", "GLUCOSE", "BUN", "CREATININE", "CALCIUM", "PROT", "ALBUMIN", "AST", "ALT", "ALKPHOS", "BILITOT", "GFRNONAA", "GFRAA"  No results found for: "WBC", "RBC", "HGB", "HCT", "PLT", "MCV", "MCH", "MCHC", "RDW", "LYMPHSABS", "MONOABS", "EOSABS", "BASOSABS"  No results found for: "POCLITH", "LITHIUM"   No results found for: "PHENYTOIN", "PHENOBARB", "VALPROATE", "CBMZ"   .res Assessment: Plan:    Derek [name] was seen today for follow-up and anxiety.  Diagnoses and all orders for this visit:  Social anxiety disorder -     buPROPion ER (WELLBUTRIN SR) 100 MG 12 hr tablet; Take 1 tablet (100 mg total) by mouth daily. -     PARoxetine (PAXIL) 10 MG tablet; Take 1 tablet (10 mg total) by mouth daily. -     propranolol (INDERAL) 20 MG tablet; TAKE ONE TO TWO TABLETS BY MOUTH TWICE A DAY AS NEEDED FOR ANXIETY  Excessive sweating  Performance anxiety -     PARoxetine (PAXIL) 10 MG tablet; Take 1 tablet (10 mg total) by mouth daily. -     propranolol (INDERAL) 20 MG tablet; TAKE ONE TO TWO TABLETS BY MOUTH TWICE A DAY AS NEEDED FOR ANXIETY    No change indicated with good response.  Overall better response with paroxetine then other meds tried as noted above.  Still has some sexual side effects but they are manageable.  High relapse risk of stops medication.  He is agreeable to continuing the meds without change. Continue propranolol.  Disc management of diarrhea from anxiety.  NAC is not as helpful as he'd like cognitively.  Wants a bit more help. Wellbutrin SR 100 mg AM for energy, cognition, appetite suppressant helped Continue paroxetine to 10 mg nightly  Successful weight loss and exercise. No med changes. Don't stop DT high relapse risk  Continue counseling Vivia Budge  FU 12 mos  Lynder Parents, MD,  [facility]   Please see After Visit Summary for patient specific instructions.  No future appointments.   No orders of the defined types were placed in this encounter.   -------------------------------

## 2022-10-30 ENCOUNTER — Other Ambulatory Visit: Payer: Self-pay | Admitting: Psychiatry

## 2022-10-30 DIAGNOSIS — F4010 Social phobia, unspecified: Secondary | ICD-10-CM

## 2023-04-08 ENCOUNTER — Other Ambulatory Visit: Payer: Self-pay | Admitting: Psychiatry

## 2023-04-08 DIAGNOSIS — F418 Other specified anxiety disorders: Secondary | ICD-10-CM

## 2023-04-08 DIAGNOSIS — F4010 Social phobia, unspecified: Secondary | ICD-10-CM

## 2023-04-08 NOTE — Telephone Encounter (Signed)
Has appt 9/30

## 2023-04-13 ENCOUNTER — Encounter: Payer: Self-pay | Admitting: Psychiatry

## 2023-04-13 ENCOUNTER — Ambulatory Visit: Payer: BC Managed Care – PPO | Admitting: Psychiatry

## 2023-04-13 DIAGNOSIS — F4010 Social phobia, unspecified: Secondary | ICD-10-CM | POA: Diagnosis not present

## 2023-04-13 DIAGNOSIS — F418 Other specified anxiety disorders: Secondary | ICD-10-CM

## 2023-04-13 DIAGNOSIS — R61 Generalized hyperhidrosis: Secondary | ICD-10-CM | POA: Diagnosis not present

## 2023-04-13 DIAGNOSIS — F5221 Male erectile disorder: Secondary | ICD-10-CM

## 2023-04-13 MED ORDER — PROPRANOLOL HCL 20 MG PO TABS
ORAL_TABLET | ORAL | 1 refills | Status: DC
Start: 2023-04-13 — End: 2024-04-13

## 2023-04-13 MED ORDER — LITHIUM CARBONATE 150 MG PO CAPS
150.0000 mg | ORAL_CAPSULE | Freq: Every day | ORAL | 3 refills | Status: DC
Start: 2023-04-13 — End: 2024-04-14

## 2023-04-13 MED ORDER — BUPROPION HCL ER (SR) 100 MG PO TB12: 100.0000 mg | ORAL_TABLET | Freq: Every day | ORAL | 3 refills | Status: DC

## 2023-04-13 MED ORDER — PAROXETINE HCL 10 MG PO TABS
10.0000 mg | ORAL_TABLET | Freq: Every day | ORAL | 3 refills | Status: DC
Start: 2023-04-13 — End: 2023-12-10

## 2023-04-13 NOTE — Progress Notes (Signed)
Derek [name] [id number] [birth year] 57 y.o.  Subjective:   Patient ID:  Derek [name] is a 57 y.o. (DOB Jul 13, 1966) male.  Chief Complaint:  Chief Complaint  Patient presents with   Follow-up     HPI Derek [name] presents to the office today for follow-up of anxiety, especially social anxiety disorder.    at visit February 16, 2019.  Because of sexual side effects with paroxetine we switched him to Viibryd.  Also initiated a trial of trial Qbrexxza for excessive sweating.   At visit September 17 after switch from paroxetine to Viibryd 20 mg he noticed a reduction in sexual side effects but an increase in anxiety.  The plan was to increase the Viibryd to 40 mg to get maximum benefit for anxiety.Also trial Qbrexxza for excessive sweating.    seen May 11, 2019 on Viibryd 40 mg daily.  He wanted to continue that dose for 6 more weeks to see if he get additional improvement in anxiety.  He also continued on lithium 150 mg daily and propranolol 20 mg twice daily as needed anxiety   seen December 2020. Doing well except ED and slidenifil Rx.   seen September 05, 2019.  The following was noted: Good and very pleased with Viagara.  Insurance is no longer covering Viibryd.  Option Trintellix was given.  Got brain zaps with one missed dose of Viibryd and it was frightening. Anxiety is fairly well controlled except situationally.  Occ use of propranolol.   Other SSRI either revved or blunted energy and feels balanced now.  Not going to gym Dt Covid.  Tried propranolol once with benefit. DT costs must change.  Option fluoxetine or return to paroxetine +/- buspirone.   He's ambialent about which SSRI.   Return to paroxetine alone 20 mg per his choice.   November 02, 2019, appointment, the following is noted: It feels good and familiar. Sleep is good.  Feels a little more sedate than in the past.  Doing transcendental meditation will nod off more than before.  A little better for rumination and worry.   Jaw and dry mouth is a little worse.  Sex SE anorgasmia.  Viagra helps erection. [name] detox.  Feels a lot more centered and more clear with the diet.  No alcohol nor sugar.  Anxiety is managed.  No panic since here.     When first came in had come off anxiety and had panic and froze in public speaking for 30 min.  Wonders if has PTSD from it and fear of recurrence. Better sleep.  Training in trancendental meditation to help.  02/01/20 appt with the following noted:   Still pleased with paroxetine and using propranolol. Overall good response.    Sleeping fine.  Not depressed.   SE manageable. Plan no changes  08/01/2020 appt noted: Good overall and anxiety managed.  Dry mouth noticeable and also constipating.. Paroxetine working well.  Much less anxiety and sweating when meets clients and some anxiety.  Tried 20mg  and had SE dryness and sexual SE.  Less fixated on public speaking and getting anxious over that.  Has used propranolol for public speaking anxiety for 40-98 mg daily with some benefit and when flying and this has helped..  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  He is planning planted aquarium Plan; Continue propranolol. Reduce paroxetine to 15 mg daily bc dryness and anxiety managed for months.   03/06/2021 appointment with the following noted: Done fine with lower dose paroxxetine and  some reduction in dryness.   And less sexual SE. 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   OCC BM urgency causes anxiety. Patient reports stable mood and denies depressed or irritable moods.  Patient denies any recent difficulty with anxiety managed well with both meds.  Patient denies difficulty with sleep initiation or maintenance. Denies appetite disturbance.  Patient reports that energy and motivation have been good.  Patient denies any difficulty with concentration.  Patient denies any suicidal ideation. Lost taste and smell from Covid and ENT says chronic sinusitis and on  antibiotic.   SE gained 10-15# with paroxetine and ? Still sluggishness and forgetfulness in afternoon. Tends to stay up to late but really needs 8 hours. Some word-finding issues.  No caffeiene.    Plan: Continue propranolol. Continue paroxetine to 15 mg vs reduce to 10 mg to minimize SE daily bc dryness and anxiety managed for months. Disc management of diarrhea from anxiety. NAC is not as helpful as he'd like cognitively.  Wants a bit more help. Wellbutrin SR 100 mg AM for energy, cognition, appetite suppressant. Wait 2-3 week and reduce paroxetine to 10 mg nightly  06/05/2021 appointment with the following noted: Best combination I've ever had with better energy on Wellbutrin added.  More like myself. Still some phobia but not problematic avoidance.   Found out have DM 2 and on edge about it..  Not fully treated yet.  Starting metformin. Questions of paroxetine and DM 2.   Still unrecovered taste and smell from Covid. Lost 8 # with dietary changes and A1C is better.   SE dryness increased with betablocers. Phobia of air travel. Plan: NAC is not as helpful as he'd like cognitively.  Wants a bit more help. Wellbutrin SR 100 mg AM for energy, cognition, appetite suppressant helped Continue paroxetine to 10 mg nightly Continue propranolol as needed anxiety  10/08/2021 appointment with the following noted: Great combination.  Better energy with Wellbutrin.  No med complaints.  Sx under good control. SE a little slow.  A little longer to do things.  Calmer with meds.  Not impaired.   Sleep not enough DT schedule.  Stays up too late. Reduced drinking by 90% and pleased by that. No caffeine. Plan: NAC is not as helpful as he'd like cognitively.  Wants a bit more help. Wellbutrin SR 100 mg AM for energy, cognition, appetite suppressant helped Continue paroxetine to 10 mg nightly  04/10/2022 appointment noted: Great.  No med complaints.  Pleased with meds.  Hope it holds out for a long  time. Not a problem with SE Anxiety more manageable than in a decade or so. Not depressed. Sleep is ok. Therapy with Karmen Bongo is going well.  Good match. Lost 30#. And A!C is better and no brain fog anymore. Not drinking nearly as much.  04/13/23 appt noted: Meds: Wellbuitrin SR 100 AM, paroxetine 10 mg, Lithium 150 mg daily, propranlol 20 prn perf anxiety. SE dry mouth. Still exercises.   Good mood and anxiety.  No psychotic fx.  No H/ISI.   Not dep.  Anxiety still a constant state of restless chronically.  Can manage it but it is longterm.   Better sleep.    Prior psych meds:  Zoloft 50 cog SE and felt physically revved even after a year and initital insomnia,  Lexapro 20, Viibryd 40 mg, fluoxetine paroxetine helped but sexual SE and dryness,  Wellbutr SR 100 NAC  Stopped drinking on his own  Review of Systems:  Review of  Systems  Constitutional:  Negative for fatigue.  HENT:         Dryness  Musculoskeletal:  Positive for arthralgias.  Neurological:  Negative for tremors.    Medications: I have reviewed the patient's current medications.  Current Outpatient Medications  Medication Sig Dispense Refill   Acetylcysteine 600 MG CAPS Take 600 mg by mouth daily.     Ascorbic Acid (VITAMIN C) 1000 MG tablet Take 1,000 mg by mouth daily.     metFORMIN (GLUCOPHAGE-XR) 750 MG 24 hr tablet Take 750 mg by mouth daily. Not taking yet     sildenafil (VIAGRA) 100 MG tablet Take 1 tablet (100 mg total) by mouth daily as needed for erectile dysfunction. 30 tablet 3   Zinc Sulfate (ZINC 15 PO) Take by mouth.     buPROPion ER (WELLBUTRIN SR) 100 MG 12 hr tablet Take 1 tablet (100 mg total) by mouth daily. 90 tablet 3   lithium carbonate 150 MG capsule Take 1 capsule (150 mg total) by mouth daily. 90 capsule 3   PARoxetine (PAXIL) 10 MG tablet Take 1 tablet (10 mg total) by mouth daily. 90 tablet 3   propranolol (INDERAL) 20 MG tablet TAKE ONE TO TWO TABLETS BY MOUTH TWICE A DAY AS  NEEDED FOR ANXIETY 100 tablet 1   No current facility-administered medications for this visit.    Medication Side Effects: Other: Sexual side effects manageable  Allergies: No Known Allergies  Past Medical History:  Diagnosis Date   Anxiety     Family History  Problem Relation Age of Onset   Anxiety disorder Father    Depression Father     Social History   Socioeconomic History   Marital status: Married    Spouse name: Not on file   Number of children: Not on file   Years of education: Not on file   Highest education level: Not on file  Occupational History   Not on file  Tobacco Use   Smoking status: Never   Smokeless tobacco: Former  Substance and Sexual Activity   Alcohol use: Yes    Comment: weekly-social   Drug use: Never   Sexual activity: Yes    Partners: Female    Birth control/protection: None  Other Topics Concern   Not on file  Social History Narrative   Not on file   Social Determinants of Health   Financial Resource Strain: Not on file  Food Insecurity: Not on file  Transportation Needs: Not on file  Physical Activity: Not on file  Stress: Not on file  Social Connections: Not on file  Intimate Partner Violence: Not on file    Past Medical History, Surgical history, Social history, and Family history were reviewed and updated as appropriate.   Please see review of systems for further details on the patient's review from today.   Objective:   Physical Exam:  There were no vitals taken for this visit.  Physical Exam Constitutional:      General: He is not in acute distress. Musculoskeletal:        General: No deformity.  Neurological:     Mental Status: He is alert and oriented to person, place, and time.     Coordination: Coordination normal.  Psychiatric:        Attention and Perception: Attention and perception normal. He does not perceive auditory or visual hallucinations.        Mood and Affect: Mood is anxious. Mood is not  depressed. Affect is not  labile, blunt, angry or inappropriate.        Speech: Speech normal.        Behavior: Behavior normal.        Thought Content: Thought content normal. Thought content is not paranoid or delusional. Thought content does not include homicidal or suicidal ideation. Thought content does not include suicidal plan.        Cognition and Memory: Cognition and memory normal.        Judgment: Judgment normal.     Comments: Insight intact Anxietywell controlled     Lab Review:  No results found for: "NA", "K", "CL", "CO2", "GLUCOSE", "BUN", "CREATININE", "CALCIUM", "PROT", "ALBUMIN", "AST", "ALT", "ALKPHOS", "BILITOT", "GFRNONAA", "GFRAA"  No results found for: "WBC", "RBC", "HGB", "HCT", "PLT", "MCV", "MCH", "MCHC", "RDW", "LYMPHSABS", "MONOABS", "EOSABS", "BASOSABS"  No results found for: "POCLITH", "LITHIUM"   No results found for: "PHENYTOIN", "PHENOBARB", "VALPROATE", "CBMZ"   .res Assessment: Plan:    Norwin was seen today for follow-up.  Diagnoses and all orders for this visit:  Social anxiety disorder -     buPROPion ER (WELLBUTRIN SR) 100 MG 12 hr tablet; Take 1 tablet (100 mg total) by mouth daily. -     lithium carbonate 150 MG capsule; Take 1 capsule (150 mg total) by mouth daily. -     PARoxetine (PAXIL) 10 MG tablet; Take 1 tablet (10 mg total) by mouth daily. -     propranolol (INDERAL) 20 MG tablet; TAKE ONE TO TWO TABLETS BY MOUTH TWICE A DAY AS NEEDED FOR ANXIETY  Excessive sweating  Performance anxiety -     PARoxetine (PAXIL) 10 MG tablet; Take 1 tablet (10 mg total) by mouth daily. -     propranolol (INDERAL) 20 MG tablet; TAKE ONE TO TWO TABLETS BY MOUTH TWICE A DAY AS NEEDED FOR ANXIETY  Erectile disorder, acquired, generalized, moderate    No change indicated with good response.  Overall better response with paroxetine then other meds tried as noted above.  Still has some sexual side effects but they are manageable.  High relapse risk of  stops medication.  He is agreeable to continuing the meds without change. Continue propranolol.  Disc management of diarrhea from anxiety.  NAC is not as helpful as he'd like cognitively.  Wants a bit more help. Wellbutrin SR 100 mg AM for energy, cognition, appetite suppressant helped Continue paroxetine to 10 mg nightly  Successful weight loss and exercise. No med changes. Don't stop DT high relapse risk  We discussed the short-term risks associated with benzodiazepines including sedation and increased fall risk among others.  Discussed long-term side effect risk including dependence, potential withdrawal symptoms, and the potential eventual dose-related risk of dementia.  But recent studies from 2020 dispute this association between benzodiazepines and dementia risk. Newer studies in 2020 do not support an association with dementia.  Disc this option again at length.  Continue counseling Karmen Bongo  FU 12 mos  Meredith Staggers, MD, DFAPA   Please see After Visit Summary for patient specific instructions.  No future appointments.   No orders of the defined types were placed in this encounter.   -------------------------------

## 2023-09-27 IMAGING — CT CT CARDIAC CORONARY ARTERY CALCIUM SCORE
3 series · 14 of 20 positions shown, 16 images · non-contrast
Comparison: None.

CLINICAL DATA: 55-year-old Caucasian male with history of
hyperlipidemia, hypertension, diabetes and family history of heart
disease.

EXAM:
CT CARDIAC CORONARY ARTERY CALCIUM SCORE
TECHNIQUE: Non-contrast imaging through the heart was performed using
prospective ECG gating. Image post processing was performed on an
independent workstation, allowing for quantitative analysis of the
heart and coronary arteries. Note that this exam targets the heart
and the chest was not imaged in its entirety.

[Series 2: calcium scoring 2.00 qr36 bestdiast 69% hrt calciu · axial · 0.44mm/px · z∈[+1567,+1661]mm · 4 of 77 slices shown]
[im 16/77  vessel]
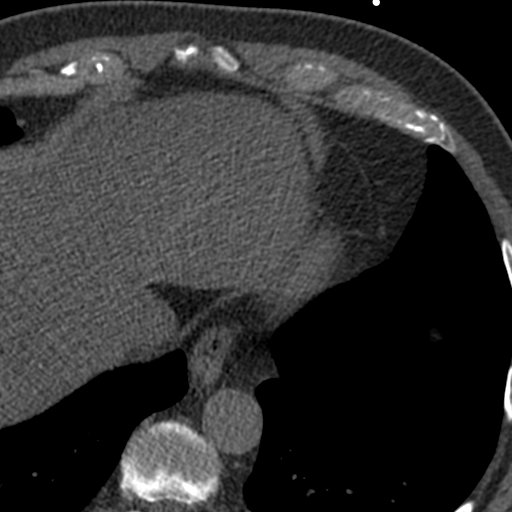
[im 31/77  vessel]
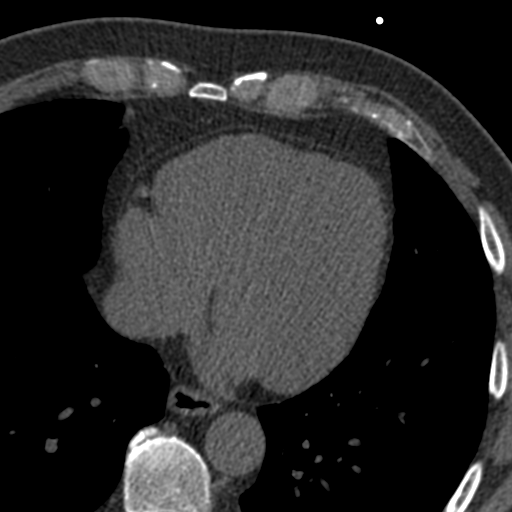
[im 46/77  vessel]
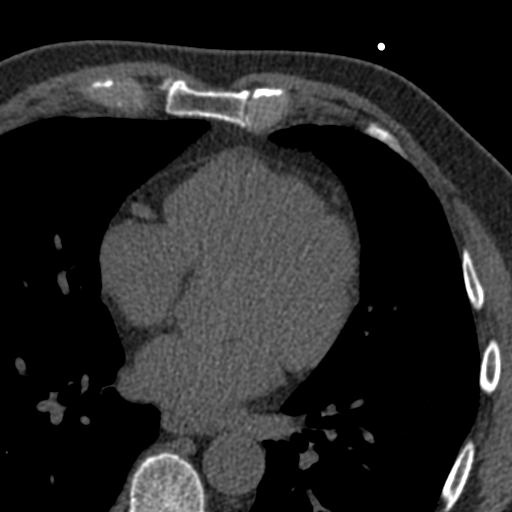
[im 61/77  vessel]
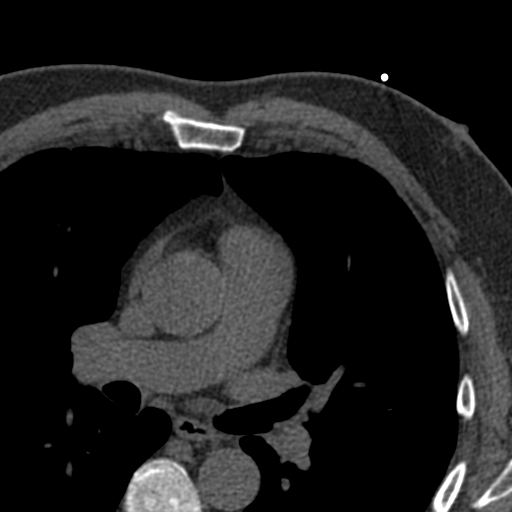

[Series 3: calcium scoring 2.00 br40 bestdiast 69% axial · axial · 0.64mm/px · z∈[+1561,+1665]mm · 5 of 80 slices shown, 7 images]
[im 14/80  vessel]
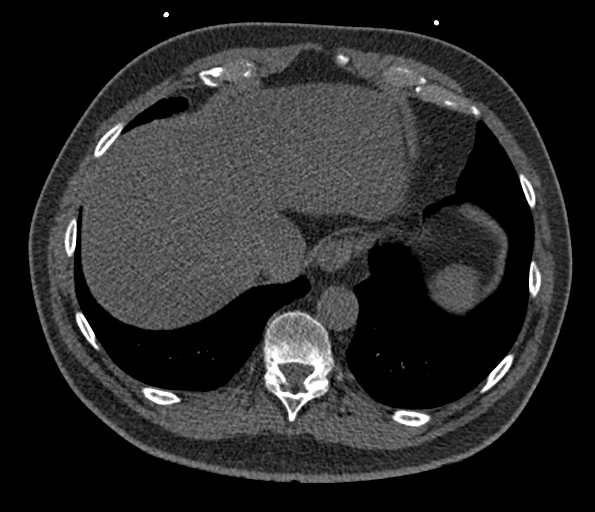
[im 14/80  lung]
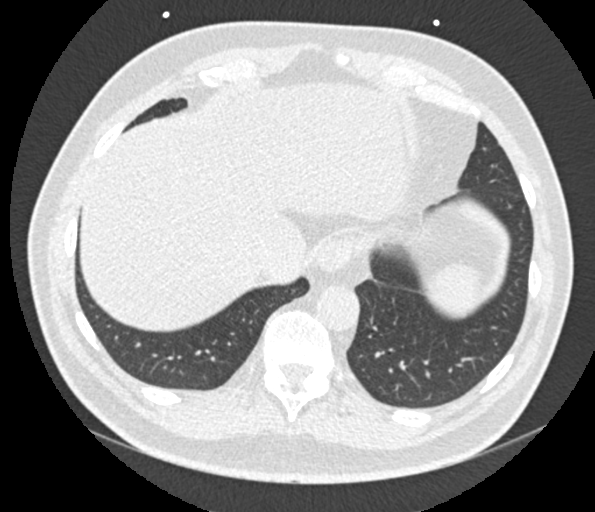
[im 27/80  vessel]
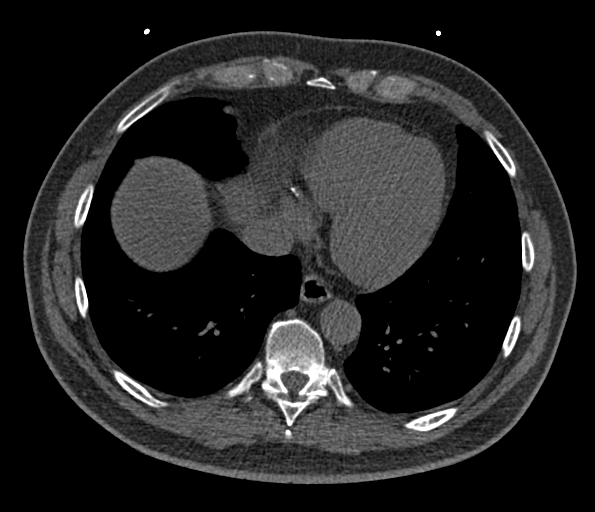
[im 40/80  vessel]
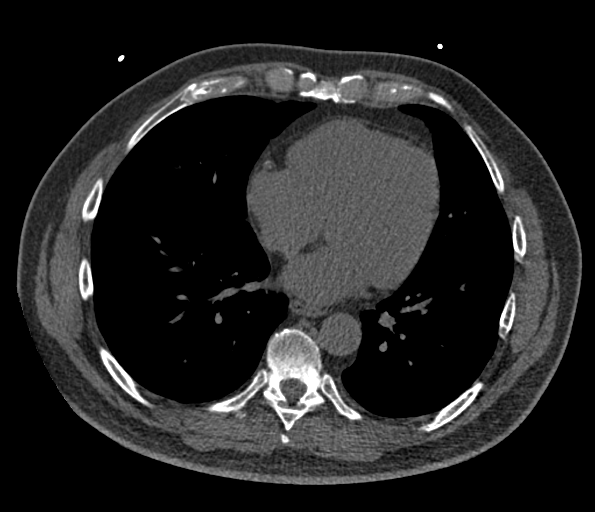
[im 53/80  vessel]
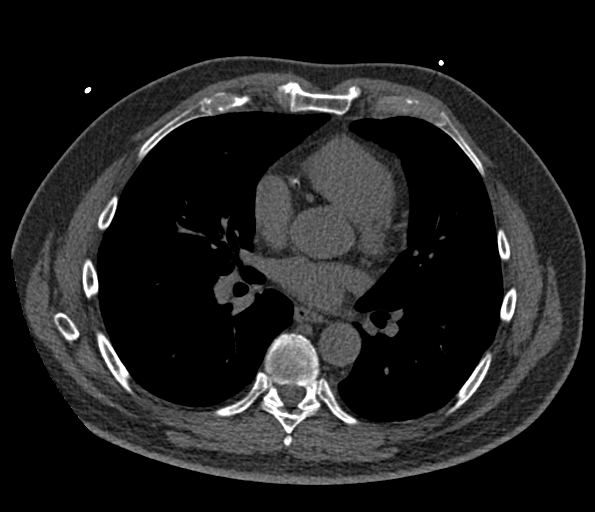
[im 66/80  vessel]
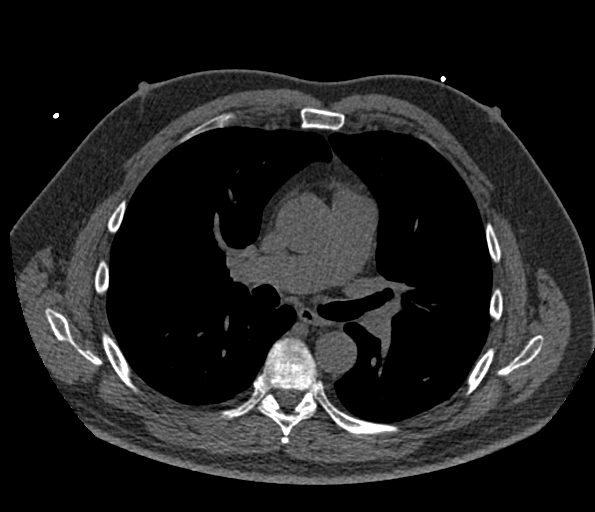
[im 66/80  lung]
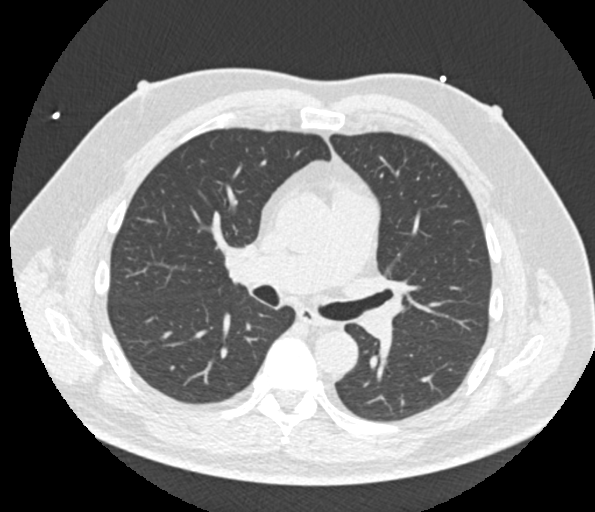

[Series 9: calcium scoring 2.00 br60 bestdiast 69% lungs · axial · 0.64mm/px · z∈[+1561,+1665]mm · 5 of 80 slices shown]
[im 14/80  vessel]
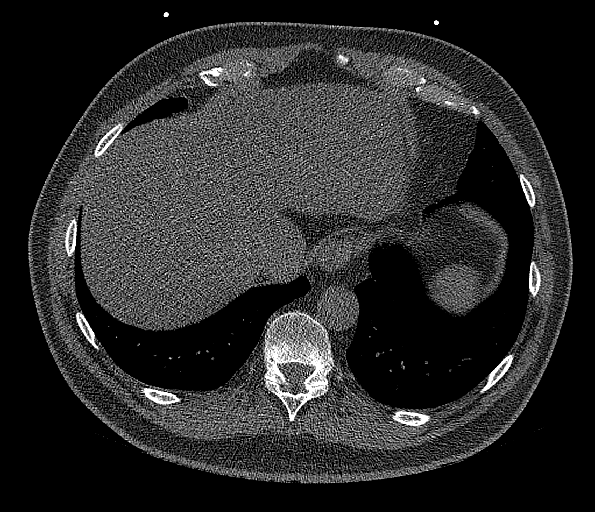
[im 27/80  vessel]
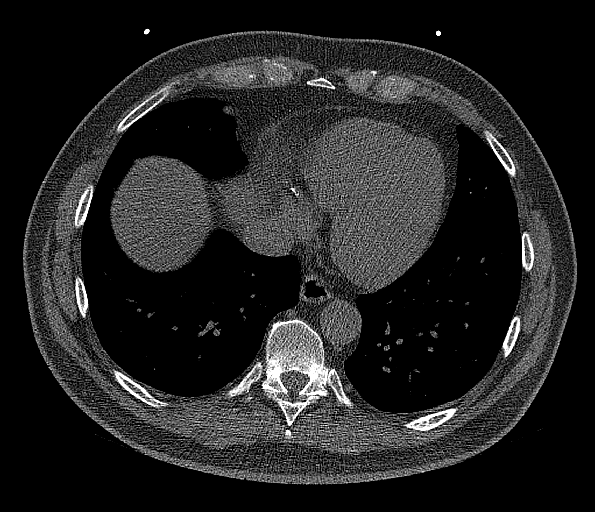
[im 40/80  vessel]
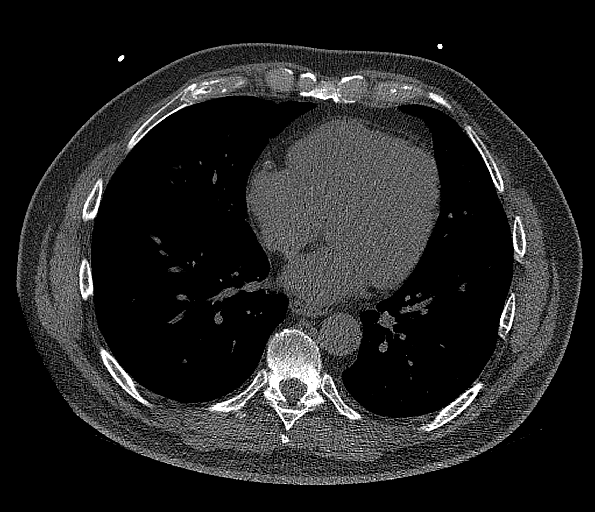
[im 53/80  vessel]
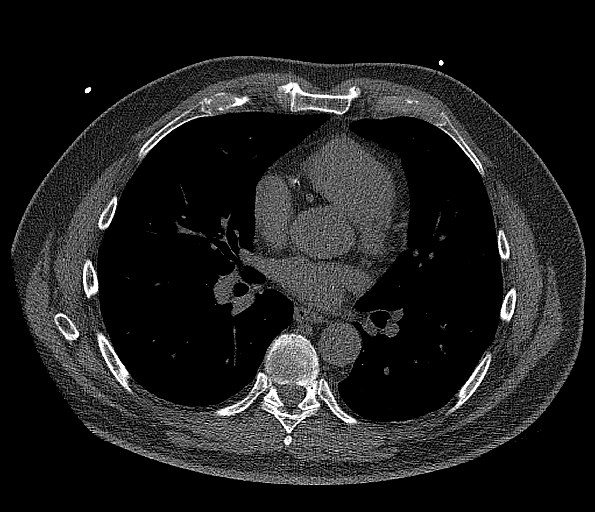
[im 66/80  vessel]
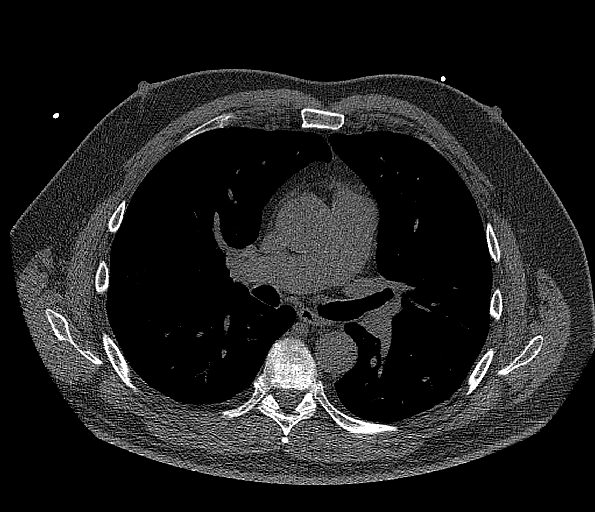

[14 of 20 positions shown; findings below may reference images not displayed]

FINDINGS: CORONARY CALCIUM SCORES:

Left Main: 0

LAD:

LCx: 0

RCA: 60

Total Agatston Score:

[HOSPITAL] percentile: 71

AORTA MEASUREMENTS:

Ascending Aorta: 34 mm

Descending Aorta: 26 mm

OTHER FINDINGS:

The heart size is within normal limits. No pericardial fluid is
identified. Visualized segments of the thoracic aorta and central
pulmonary arteries are normal in caliber. Visualized mediastinum and
hilar regions demonstrate no lymphadenopathy or masses. Visualized
lungs show no evidence of pulmonary edema, consolidation,
pneumothorax, nodule or pleural fluid. The liver demonstrates
evidence of probable steatosis. Visualized bony structures are
unremarkable.
IMPRESSION: 1. Coronary calcium score of 60.5 is at the 71st percentile for the
patient's age, sex and race.
2. Probable hepatic steatosis.

## 2023-11-08 ENCOUNTER — Other Ambulatory Visit: Payer: Self-pay | Admitting: Psychiatry

## 2023-11-08 DIAGNOSIS — F5221 Male erectile disorder: Secondary | ICD-10-CM

## 2023-12-10 ENCOUNTER — Encounter: Payer: Self-pay | Admitting: Psychiatry

## 2023-12-10 ENCOUNTER — Ambulatory Visit (INDEPENDENT_AMBULATORY_CARE_PROVIDER_SITE_OTHER): Payer: Self-pay | Admitting: Psychiatry

## 2023-12-10 DIAGNOSIS — F418 Other specified anxiety disorders: Secondary | ICD-10-CM | POA: Diagnosis not present

## 2023-12-10 DIAGNOSIS — F4010 Social phobia, unspecified: Secondary | ICD-10-CM | POA: Diagnosis not present

## 2023-12-10 DIAGNOSIS — F5221 Male erectile disorder: Secondary | ICD-10-CM

## 2023-12-10 DIAGNOSIS — R61 Generalized hyperhidrosis: Secondary | ICD-10-CM

## 2023-12-10 MED ORDER — FLUVOXAMINE MALEATE 50 MG PO TABS
ORAL_TABLET | ORAL | 1 refills | Status: DC
Start: 2023-12-10 — End: 2024-02-08

## 2023-12-10 NOTE — Addendum Note (Signed)
 Addended by: COTTLE, Maxene Byington G on: 12/10/2023 11:27 AM   Modules accepted: Orders

## 2023-12-10 NOTE — Progress Notes (Addendum)
 Derek Derek [name] [id number] 12-Dec-1965 58 y.o.  Subjective:   Patient ID:  Derek Derek [name] is a 58 y.o. (DOB 06-29-1966) male.  Chief Complaint:  Chief Complaint  Patient presents with   Derek [name]   Derek [name]     HPI Derek Derek [name], Derek social Derek [name] disorder.    at visit February 16, 2019.  Because of sexual side effects with paroxetine  we switched him to Viibryd .  Also initiated a trial of trial Qbrexxza for excessive sweating.   At visit September 17 after switch from paroxetine  to Viibryd  20 mg he noticed a reduction in sexual side effects but an increase in Derek [name].  The plan was to increase the Viibryd  to 40 mg to get maximum benefit for Derek [name].Also trial Qbrexxza for excessive sweating.    seen May 11, 2019 on Viibryd  40 mg daily.  He wanted to continue that dose for 6 more weeks to see if he get additional improvement in Derek [name].  He also continued on lithium  150 mg daily and propranolol  20 mg twice daily as needed Derek [name]   seen December 2020. Doing well except ED and slidenifil Rx.   seen September 05, 2019.  The following was noted: Good and very pleased with Viagara.  Insurance is no longer covering Viibryd .  Option Trintellix was given.  Got brain zaps with one missed dose of Viibryd  and it was frightening. Derek [name] is fairly well controlled except situationally.  Occ use of propranolol .   Other SSRI either revved or blunted energy and feels balanced now.  Not going to gym Dt Covid.  Tried propranolol  once with benefit. DT costs must change.  Option fluoxetine or return to paroxetine  +/- buspirone.   He's ambialent about which SSRI.   Return to paroxetine  alone 20 mg per his choice.   November 02, 2019, appointment, the following is noted: It feels good and familiar. Sleep is good.  Feels a little more sedate than in the past.  Doing transcendental meditation will nod off more than before.  A little better for rumination  and worry.  Jaw and dry mouth is a little worse.  Sex SE anorgasmia.  Viagra  helps erection. [name] detox.  Feels a lot more centered and more clear with the diet.  No alcohol nor sugar.  Derek [name] is managed.  No panic since here.     When first came in had come off Derek [name] and had panic and froze in public speaking for 30 min.  Wonders if has PTSD from it and fear of recurrence. Better sleep.  Training in trancendental meditation to help.  02/01/20 appt with the following noted:   Still pleased with paroxetine  and using propranolol . Overall good response.    Sleeping fine.  Not depressed.   SE manageable. Plan no changes  08/01/2020 appt noted: Good overall and Derek [name] managed.  Dry mouth noticeable and also constipating.. Paroxetine  working well.  Much less Derek [name] and sweating when meets clients and some Derek [name].  Tried 20mg  and had SE dryness and sexual SE.  Less fixated on public speaking and getting anxious over that.  Has used propranolol  for public speaking Derek [name] for 20-40 mg daily with some benefit and when flying and this has helped..  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  He is planning planted aquarium Plan; Continue propranolol . Reduce paroxetine  to 15 mg daily bc dryness and Derek [name] managed for months.   03/06/2021 appointment with the following noted: Done fine with lower  dose paroxxetine and some reduction in dryness.   And less sexual SE. 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   OCC BM urgency causes Derek [name]. Patient reports stable mood and denies depressed or irritable moods.  Patient denies any recent difficulty with Derek [name] managed well with both meds.  Patient denies difficulty with sleep initiation or maintenance. Denies appetite disturbance.  Patient reports that energy and motivation have been good.  Patient denies any difficulty with concentration.  Patient denies any suicidal ideation. Lost taste and smell from Covid and ENT says chronic sinusitis and  on antibiotic.   SE gained 10-15# with paroxetine  and ? Still sluggishness and forgetfulness in afternoon. Tends to stay up to late but really needs 8 hours. Some word-finding issues.  No caffeiene.    Plan: Continue propranolol . Continue paroxetine  to 15 mg vs reduce to 10 mg to minimize SE daily bc dryness and Derek [name] managed for months. Disc management of diarrhea from Derek [name]. NAC is not as helpful as he'd like cognitively.  Wants a bit more help. Wellbutrin  SR 100 mg AM for energy, cognition, appetite suppressant. Wait 2-3 week and reduce paroxetine  to 10 mg nightly  06/05/2021 appointment with the following noted: Best combination I've ever had with better energy on Wellbutrin  added.  More like myself. Still some phobia but not problematic avoidance.   Found out have DM 2 and on edge about it..  Not fully treated yet.  Starting metformin. Questions of paroxetine  and DM 2.   Still unrecovered taste and smell from Covid. Lost 8 # with dietary changes and A1C is better.   SE dryness increased with betablocers. Phobia of air travel. Plan: NAC is not as helpful as he'd like cognitively.  Wants a bit more help. Wellbutrin  SR 100 mg AM for energy, cognition, appetite suppressant helped Continue paroxetine  to 10 mg nightly Continue propranolol  as needed Derek [name]  10/08/2021 appointment with the following noted: Great combination.  Better energy with Wellbutrin .  No med complaints.  Sx under good control. SE a little slow.  A little longer to do things.  Calmer with meds.  Not impaired.   Sleep not enough DT schedule.  Stays up too late. Reduced drinking by 90% and pleased by that. No caffeine. Plan: NAC is not as helpful as he'd like cognitively.  Wants a bit more help. Wellbutrin  SR 100 mg AM for energy, cognition, appetite suppressant helped Continue paroxetine  to 10 mg nightly  04/10/2022 appointment noted: Great.  No med complaints.  Pleased with meds.  Hope it holds out for a  long time. Not a problem with SE Derek [name] more manageable than in a decade or so. Not depressed. Sleep is ok. Therapy with Victorio Grave is going well.  Good match. Lost 30#. And A!C is better and no brain fog anymore. Not drinking nearly as much.  04/13/23 appt noted: Meds: Wellbuitrin SR 100 AM, paroxetine  10 mg, Lithium  150 mg daily, propranlol 20 prn perf Derek [name]. SE dry mouth. Still exercises.   Good mood and Derek [name].  No psychotic fx.  No H/ISI.   Not dep.  Derek [name] still a constant state of restless chronically.  Can manage it but it is longterm.   Better sleep.    12/10/23 appt noted:  appt moved up Meds: Wellbuitrin SR 100 AM, paroxetine  10 mg, Lithium  150 mg daily, propranlol 20 prn perf Derek [name]. Seeing Victorio Grave.   Has notes.   Looping intrusive thoughts on things that aren't resolvable gotten worse but had it since 7  th grade.  Increase Derek [name] and make him feel closed in.  Obs fear of failure.   Regrets over things he did in childhood.   Obs over VD despite never having sex as kid.  In college obs over HIV with no evidence.   2 kids with ADD and he and wife think he has ADD too.  He wonders if the looping is related to ADD.  More snappy an d keyed up.  Some moments of dep which hasn't been a px before.  Less motivation.   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   Some health concerns with DM and heart dz.   Under pressure at work and performance Derek [name] fueled by perfectionism.  Anticipatory Derek [name].   2-3 cups coffee per day.  Prior psych meds:   Zoloft 50 cog SE and felt physically revved even after a year and initital insomnia,  Lexapro 20, Viibryd  40 mg, fluoxetine paroxetine  helped but sexual SE and dryness,  Wellbutr SR 100 NAC  Stopped drinking on his own  Review of Systems:  Review of Systems  Constitutional:  Negative for fatigue.  HENT:         Dryness  Musculoskeletal:  Positive for arthralgias.   Neurological:  Negative for tremors.  Psychiatric/Behavioral:  The patient is nervous/anxious.     Medications: I have reviewed the patient's current medications.  Current Outpatient Medications  Medication Sig Dispense Refill   Acetylcysteine 600 MG CAPS Take 600 mg by mouth daily.     Ascorbic Acid (VITAMIN C) 1000 MG tablet Take 1,000 mg by mouth daily.     buPROPion  ER (WELLBUTRIN  SR) 100 MG 12 hr tablet Take 1 tablet (100 mg total) by mouth daily. 90 tablet 3   FARXIGA 10 MG TABS tablet Take 10 mg by mouth daily.     lithium  carbonate 150 MG capsule Take 1 capsule (150 mg total) by mouth daily. 90 capsule 3   metFORMIN (GLUCOPHAGE-XR) 750 MG 24 hr tablet Take 750 mg by mouth daily. Not taking yet     propranolol  (INDERAL ) 20 MG tablet TAKE ONE TO TWO TABLETS BY MOUTH TWICE A DAY AS NEEDED FOR Derek [name] 100 tablet 1   rosuvastatin (CRESTOR) 20 MG tablet Take 20 mg by mouth daily.     sildenafil  (VIAGRA ) 100 MG tablet TAKE 1 TABLET BY MOUTH DAILY AS NEEDED FOR FOR ERECTILE DYSFUNCTION 30 tablet 0   Zinc Sulfate (ZINC 15 PO) Take by mouth.     fluvoxaMINE (LUVOX) 50 MG tablet 1/2 tablet at night for 1 week, then 1 tablet at night for 1 week, then 1 and 1/2 tablets at night for 1 week, then 2 nightly (Patient not taking: Reported on 12/10/2023) 60 tablet 1   No current facility-administered medications for this visit.    Medication Side Effects: Other: Sexual side effects manageable  Allergies: No Known Allergies  Past Medical History:  Diagnosis Date   Derek [name]     Family History  Problem Relation Age of Onset   Derek [name] disorder Father    Depression Father     Social History   Socioeconomic History   Marital status: Married    Spouse name: Not on file   Number of children: Not on file   Years of education: Not on file   Highest education level: Not on file  Occupational History   Not on file  Tobacco Use   Smoking status: Never  Smokeless tobacco: Former  Substance  and Sexual Activity   Alcohol use: Yes    Comment: weekly-social   Drug use: Never   Sexual activity: Yes    Partners: Female    Birth control/protection: None  Other Topics Concern   Not on file  Social History Narrative   Not on file   Social Drivers of Health   Financial Resource Strain: Not on file  Food Insecurity: Not on file  Transportation Needs: Not on file  Physical Activity: Not on file  Stress: Not on file  Social Connections: Not on file  Intimate Partner Violence: Not on file    Past Medical History, Surgical history, Social history, and Family history were reviewed and updated as appropriate.   Please see review of systems for further details on the patient's review from today.   Objective:   Physical Exam:  There were no vitals taken for this visit.  Physical Exam Constitutional:      General: He is not in acute distress. Musculoskeletal:        General: No deformity.  Neurological:     Mental Status: He is alert and oriented to person, place, and time.     Coordination: Coordination normal.  Psychiatric:        Attention and Perception: Attention and perception normal. He does not perceive auditory or visual hallucinations.        Mood and Affect: Mood is anxious. Mood is not depressed. Affect is not labile, blunt, angry or inappropriate.        Speech: Speech normal.        Behavior: Behavior normal.        Thought Content: Thought content normal. Thought content is not paranoid or delusional. Thought content does not include homicidal or suicidal ideation. Thought content does not include suicidal plan.        Cognition and Memory: Cognition and memory normal.        Judgment: Judgment normal.     Comments: Insight intact Derek [name] worse with obs thoughts     Lab Review:  No results found for: "NA", "K", "CL", "CO2", "GLUCOSE", "BUN", "CREATININE", "CALCIUM", "PROT", "ALBUMIN", "AST", "ALT", "ALKPHOS", "BILITOT", "GFRNONAA", "GFRAA"  No results  found for: "WBC", "RBC", "HGB", "HCT", "PLT", "MCV", "MCH", "MCHC", "RDW", "LYMPHSABS", "MONOABS", "EOSABS", "BASOSABS"  No results found for: "POCLITH", "LITHIUM "   No results found for: "PHENYTOIN", "PHENOBARB", "VALPROATE", "CBMZ"   .res Assessment: Plan:    Ercole was seen today for Derek [name] and Derek [name].  Diagnoses and all orders for this visit:  Social Derek [name] disorder -     fluvoxaMINE  (LUVOX ) 50 MG tablet; 1/2 tablet at night for 1 week, then 1 tablet at night for 1 week, then 1 and 1/2 tablets at night for 1 week, then 2 nightly (Patient not taking: Reported on 12/10/2023)  Excessive sweating  Performance Derek [name] -     fluvoxaMINE  (LUVOX ) 50 MG tablet; 1/2 tablet at night for 1 week, then 1 tablet at night for 1 week, then 1 and 1/2 tablets at night for 1 week, then 2 nightly (Patient not taking: Reported on 12/10/2023)  Erectile disorder, acquired, generalized, moderate     Some OCD elements  30 min appt:   No change indicated with good response.  Overall better response with paroxetine  then other meds tried as noted above.  Still has some sexual side effects but they are manageable.  High relapse risk of stops medication.  He is agreeable to continuing the meds  without change. Continue propranolol .  Disc management of diarrhea from Derek [name].  NAC is not as helpful as he'd like cognitively.  Wants a bit more help. Wellbutrin  SR 100 mg AM for energy, cognition, appetite suppressant helped Continue paroxetine  to 10 mg nightly  Successful weight loss and exercise. No med changes. Don't stop DT high relapse risk  We discussed the short-term risks associated with benzodiazepines including sedation and increased fall risk among others.  Discussed long-term side effect risk including dependence, potential withdrawal symptoms, and the potential eventual dose-related risk of dementia.  But recent studies from 2020 dispute this association between benzodiazepines and dementia  risk. Newer studies in 2020 do not support an association with dementia.  Disc this option again at length.  Extensive disc options:  switch to fluvoxamine 100 from paroxetine .  Continue counseling Victorio Grave  FU 2 mos  Nori Beat, MD, DFAPA   Please see After Visit Summary for patient specific instructions. Reduce paroxetine  to 1/2 tablet and start fluvoxamine 50 mg tablet at night for 1 week,  then stop paroxetine  and increase fluvoxamine to 1 and 1/2 tablets at night for 1 week, If tolerated increase to 2 of the 50 mg tablets at night  Nori Beat, MD, DFAPA   Future Appointments  Date Time Provider Department Center  04/13/2024  9:00 AM Cottle, Kennedy Peabody., MD [facility] None     No orders of the defined types were placed in this encounter.   -------------------------------

## 2023-12-10 NOTE — Patient Instructions (Signed)
 Reduce paroxetine  to 1/2 tablet and start fluvoxamine 50 mg tablet at night for 1 week,  then stop paroxetine  and increase fluvoxamine to 1 and 1/2 tablets at night for 1 week, If tolerated increase to 2 of the 50 mg tablets at night

## 2024-01-14 ENCOUNTER — Telehealth: Payer: Self-pay | Admitting: Psychiatry

## 2024-01-14 NOTE — Telephone Encounter (Signed)
 Called patient and reviewed recommendations with him.

## 2024-01-14 NOTE — Telephone Encounter (Signed)
 This SE is not directly the fluvoxamine  but an interaction between fluvoxamine  and bupropion .  Fluvoxamine  raises the blood level of bupropion .  He can reduce the fluvoxamine  to 75 mg but needs to cut the bupropion  in 1/2 .  It will help quickly but if not enough let me know.

## 2024-01-14 NOTE — Telephone Encounter (Signed)
 Pt LVM @ 9:24a stating he has been increasing Luvox  from 25mg  to 100mg .  He said he is experiencing side effects like agitation, irritability and insomnia.  He would like to decrease to 75mg .  Next appt 7/28

## 2024-01-14 NOTE — Telephone Encounter (Signed)
 Please see message. It was prescribed 5/29.

## 2024-02-08 ENCOUNTER — Ambulatory Visit (INDEPENDENT_AMBULATORY_CARE_PROVIDER_SITE_OTHER): Admitting: Psychiatry

## 2024-02-08 ENCOUNTER — Encounter: Payer: Self-pay | Admitting: Psychiatry

## 2024-02-08 ENCOUNTER — Other Ambulatory Visit: Payer: Self-pay | Admitting: Psychiatry

## 2024-02-08 DIAGNOSIS — R61 Generalized hyperhidrosis: Secondary | ICD-10-CM

## 2024-02-08 DIAGNOSIS — F4010 Social phobia, unspecified: Secondary | ICD-10-CM | POA: Diagnosis not present

## 2024-02-08 DIAGNOSIS — F418 Other specified anxiety disorders: Secondary | ICD-10-CM | POA: Diagnosis not present

## 2024-02-08 DIAGNOSIS — F5221 Male erectile disorder: Secondary | ICD-10-CM
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
## 2024-03-11 ENCOUNTER — Telehealth: Payer: Self-pay | Admitting: Psychiatry

## 2024-03-11 NOTE — Telephone Encounter (Signed)
 Pt told ok to decrease the fluvoxamine  to 200 mg. He reported taking 50 mg QAM and 100 mg QPM. He will continue 100 mg qpm.

## 2024-03-11 NOTE — Telephone Encounter (Signed)
 Pt lvm @ 1:50p asking if it's ok for him to decrease Luvox  from 150mg  to 100mg .  He said Dr Geoffry told him he could if he felt he was having side effects of drowsiness, which he is.  Next appt 10/1

## 2024-04-13 ENCOUNTER — Encounter: Payer: Self-pay | Admitting: Psychiatry

## 2024-04-13 ENCOUNTER — Ambulatory Visit (INDEPENDENT_AMBULATORY_CARE_PROVIDER_SITE_OTHER): Payer: BC Managed Care – PPO | Admitting: Psychiatry

## 2024-04-13 DIAGNOSIS — F5221 Male erectile disorder: Secondary | ICD-10-CM

## 2024-04-13 DIAGNOSIS — F4010 Social phobia, unspecified: Secondary | ICD-10-CM | POA: Diagnosis not present

## 2024-04-13 DIAGNOSIS — F418 Other specified anxiety disorders: Secondary | ICD-10-CM | POA: Diagnosis not present

## 2024-04-13 MED ORDER — PROPRANOLOL HCL 20 MG PO TABS
ORAL_TABLET | ORAL | 1 refills | Status: AC
Start: 2024-04-13 — End: ?

## 2024-04-13 MED ORDER — FLUVOXAMINE MALEATE 100 MG PO TABS: 100.0000 mg | ORAL_TABLET | Freq: Every day | ORAL | 1 refills | Status: DC

## 2024-04-13 MED ORDER — BUPROPION HCL ER (SR) 100 MG PO TB12
100.0000 mg | ORAL_TABLET | Freq: Every day | ORAL | 3 refills | Status: AC
Start: 2024-04-13 — End: ?
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
## 2024-04-14 ENCOUNTER — Other Ambulatory Visit: Payer: Self-pay | Admitting: Psychiatry

## 2024-04-14 DIAGNOSIS — F4010 Social phobia, unspecified: Secondary | ICD-10-CM

## 2024-07-25 ENCOUNTER — Other Ambulatory Visit: Payer: Self-pay | Admitting: Psychiatry

## 2024-07-25 DIAGNOSIS — F4010 Social phobia, unspecified: Secondary | ICD-10-CM

## 2024-08-16 ENCOUNTER — Encounter: Payer: Self-pay | Admitting: Psychiatry

## 2024-08-16 ENCOUNTER — Ambulatory Visit: Admitting: Psychiatry

## 2024-08-16 DIAGNOSIS — F418 Other specified anxiety disorders: Secondary | ICD-10-CM

## 2024-08-16 DIAGNOSIS — F5221 Male erectile disorder: Secondary | ICD-10-CM

## 2024-08-16 DIAGNOSIS — F4010 Social phobia, unspecified: Secondary | ICD-10-CM

## 2024-08-16 MED ORDER — FLUVOXAMINE MALEATE 100 MG PO TABS: 100.0000 mg | ORAL_TABLET | Freq: Every day | ORAL | 1 refills | Status: AC
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
## 2025-02-13 ENCOUNTER — Ambulatory Visit: Admitting: Psychiatry
